# Patient Record
Sex: Female | Born: 1985 | Race: White | Hispanic: No | Marital: Married | Smoking: Never smoker
Health system: Southern US, Community
[De-identification: ages and names within clinical notes are randomized; demographics above are authoritative.]

## PROBLEM LIST (undated history)

## (undated) DIAGNOSIS — Z789 Other specified health status: Secondary | ICD-10-CM

## (undated) HISTORY — PX: NO PAST SURGERIES: SHX2092

---

## 2012-09-09 NOTE — L&D Delivery Note (Signed)
Attestation of Attending Supervision of Advanced Practitioner (CNM/NP): Evaluation and management procedures were performed by the Advanced Practitioner under my supervision and collaboration.  I have reviewed the Advanced Practitioner's note and chart, and I agree with the management and plan.  Delanda Bulluck 05/08/2013 1:26 PM

## 2012-09-09 NOTE — L&D Delivery Note (Signed)
Delivery Note At 5:11 PM a viable female was delivered via Vaginal, Spontaneous Delivery (Presentation: Right Occiput Anterior).  APGAR: 8, 9; weight 6 lb 9.8 oz (3000 g).   Placenta status: Intact, Spontaneous.  Cord: 3 vessels with the following complications: None.  Anesthesia: Local  Episiotomy: None Lacerations: 2nd degree Suture Repair: 3.0 vicryl Est. Blood Loss (mL): 200  Mom to postpartum.  Baby to nursery-stable.  Jacquelin Hawking, MD 05/04/2013, 7:03 PM  Evaluation and management procedures were performed by Resident physician under my supervision/collaboration. Chart reviewed, patient examined by me and I agree with management and plan. I did repair.

## 2012-11-17 ENCOUNTER — Other Ambulatory Visit (HOSPITAL_COMMUNITY): Payer: Self-pay | Admitting: Family

## 2012-11-17 LAB — OB RESULTS CONSOLE ANTIBODY SCREEN: Antibody Screen: NEGATIVE

## 2012-11-17 LAB — OB RESULTS CONSOLE ABO/RH

## 2012-11-17 LAB — OB RESULTS CONSOLE GC/CHLAMYDIA
Chlamydia: NEGATIVE
Gonorrhea: NEGATIVE

## 2012-12-02 ENCOUNTER — Ambulatory Visit (HOSPITAL_COMMUNITY)
Admission: RE | Admit: 2012-12-02 | Discharge: 2012-12-02 | Disposition: A | Discharge status: Home / Self Care | Payer: Self-pay | Source: Ambulatory Visit | Attending: Family | Admitting: Family

## 2012-12-02 DIAGNOSIS — Z3689 Encounter for other specified antenatal screening: Secondary | ICD-10-CM

## 2012-12-02 DIAGNOSIS — O358XX0 Maternal care for other (suspected) fetal abnormality and damage, not applicable or unspecified: Secondary | ICD-10-CM | POA: Insufficient documentation

## 2012-12-02 DIAGNOSIS — Z1389 Encounter for screening for other disorder: Secondary | ICD-10-CM | POA: Insufficient documentation

## 2012-12-02 DIAGNOSIS — Z363 Encounter for antenatal screening for malformations: Secondary | ICD-10-CM | POA: Insufficient documentation

## 2012-12-15 ENCOUNTER — Other Ambulatory Visit (HOSPITAL_COMMUNITY): Payer: Self-pay | Admitting: Physician Assistant

## 2012-12-15 DIAGNOSIS — Z0489 Encounter for examination and observation for other specified reasons: Secondary | ICD-10-CM

## 2012-12-25 ENCOUNTER — Ambulatory Visit (HOSPITAL_COMMUNITY)
Admission: RE | Admit: 2012-12-25 | Discharge: 2012-12-25 | Disposition: A | Discharge status: Home / Self Care | Payer: Self-pay | Source: Ambulatory Visit | Attending: Physician Assistant | Admitting: Physician Assistant

## 2012-12-25 DIAGNOSIS — Z3689 Encounter for other specified antenatal screening: Secondary | ICD-10-CM | POA: Insufficient documentation

## 2012-12-25 DIAGNOSIS — Z0489 Encounter for examination and observation for other specified reasons: Secondary | ICD-10-CM

## 2013-04-12 LAB — OB RESULTS CONSOLE GBS: GBS: NEGATIVE

## 2013-05-04 ENCOUNTER — Encounter (HOSPITAL_COMMUNITY): Payer: Self-pay | Admitting: *Deleted

## 2013-05-04 ENCOUNTER — Inpatient Hospital Stay (HOSPITAL_COMMUNITY)
Admission: AD | Admit: 2013-05-04 | Discharge: 2013-05-06 | DRG: 775 | Disposition: A | Discharge status: Home / Self Care | Payer: Medicaid Other | Source: Ambulatory Visit | Attending: Obstetrics & Gynecology | Admitting: Obstetrics & Gynecology

## 2013-05-04 HISTORY — DX: Other specified health status: Z78.9

## 2013-05-04 LAB — CBC
HCT: 33.4 % — ABNORMAL LOW (ref 36.0–46.0)
Hemoglobin: 10.8 g/dL — ABNORMAL LOW (ref 12.0–15.0)
RBC: 4.57 MIL/uL (ref 3.87–5.11)

## 2013-05-04 LAB — TYPE AND SCREEN
ABO/RH(D): O POS
Antibody Screen: NEGATIVE

## 2013-05-04 LAB — ABO/RH: ABO/RH(D): O POS

## 2013-05-04 MED ORDER — CITRIC ACID-SODIUM CITRATE 334-500 MG/5ML PO SOLN: 30.0000 mL | ORAL | Status: DC | PRN

## 2013-05-04 MED ORDER — LANOLIN HYDROUS EX OINT: TOPICAL_OINTMENT | CUTANEOUS | Status: DC | PRN

## 2013-05-04 MED ORDER — ONDANSETRON HCL 4 MG PO TABS: 4.0000 mg | ORAL_TABLET | ORAL | Status: DC | PRN

## 2013-05-04 MED ORDER — EPHEDRINE 5 MG/ML INJ
10.0000 mg | INTRAVENOUS | Status: DC | PRN
  Filled 2013-05-04: qty 2

## 2013-05-04 MED ORDER — PRENATAL MULTIVITAMIN CH
1.0000 | ORAL_TABLET | Freq: Every day | ORAL | Status: DC
  Administered 2013-05-05 – 2013-05-06 (×2): 1 via ORAL
  Filled 2013-05-04 (×2): qty 1

## 2013-05-04 MED ORDER — ONDANSETRON HCL 4 MG/2ML IJ SOLN: 4.0000 mg | Freq: Four times a day (QID) | INTRAMUSCULAR | Status: DC | PRN

## 2013-05-04 MED ORDER — OXYTOCIN 40 UNITS IN LACTATED RINGERS INFUSION - SIMPLE MED
62.5000 mL/h | INTRAVENOUS | Status: DC
  Administered 2013-05-04: 62.5 mL/h via INTRAVENOUS

## 2013-05-04 MED ORDER — FENTANYL 2.5 MCG/ML BUPIVACAINE 1/10 % EPIDURAL INFUSION (WH - ANES): 14.0000 mL/h | INTRAMUSCULAR | Status: DC | PRN

## 2013-05-04 MED ORDER — PHENYLEPHRINE 40 MCG/ML (10ML) SYRINGE FOR IV PUSH (FOR BLOOD PRESSURE SUPPORT)
80.0000 ug | PREFILLED_SYRINGE | INTRAVENOUS | Status: DC | PRN
  Filled 2013-05-04: qty 2

## 2013-05-04 MED ORDER — DIPHENHYDRAMINE HCL 50 MG/ML IJ SOLN: 12.5000 mg | INTRAMUSCULAR | Status: DC | PRN

## 2013-05-04 MED ORDER — IBUPROFEN 600 MG PO TABS
600.0000 mg | ORAL_TABLET | Freq: Four times a day (QID) | ORAL | Status: DC
  Administered 2013-05-04 – 2013-05-06 (×7): 600 mg via ORAL
  Filled 2013-05-04 (×6): qty 1

## 2013-05-04 MED ORDER — NALBUPHINE SYRINGE 5 MG/0.5 ML
10.0000 mg | INJECTION | INTRAMUSCULAR | Status: DC | PRN
  Administered 2013-05-04: 10 mg via INTRAVENOUS
  Filled 2013-05-04 (×2): qty 1

## 2013-05-04 MED ORDER — OXYTOCIN 40 UNITS IN LACTATED RINGERS INFUSION - SIMPLE MED
1.0000 m[IU]/min | INTRAVENOUS | Status: DC
  Administered 2013-05-04: 1 m[IU]/min via INTRAVENOUS
  Filled 2013-05-04: qty 1000

## 2013-05-04 MED ORDER — SIMETHICONE 80 MG PO CHEW: 80.0000 mg | CHEWABLE_TABLET | ORAL | Status: DC | PRN

## 2013-05-04 MED ORDER — IBUPROFEN 600 MG PO TABS
600.0000 mg | ORAL_TABLET | Freq: Four times a day (QID) | ORAL | Status: DC | PRN
  Administered 2013-05-04: 600 mg via ORAL
  Filled 2013-05-04: qty 1

## 2013-05-04 MED ORDER — LACTATED RINGERS IV SOLN: 500.0000 mL | Freq: Once | INTRAVENOUS | Status: DC

## 2013-05-04 MED ORDER — BENZOCAINE-MENTHOL 20-0.5 % EX AERO
1.0000 "application " | INHALATION_SPRAY | CUTANEOUS | Status: DC | PRN
  Administered 2013-05-04: 1 via TOPICAL
  Filled 2013-05-04: qty 56

## 2013-05-04 MED ORDER — OXYCODONE-ACETAMINOPHEN 5-325 MG PO TABS
1.0000 | ORAL_TABLET | ORAL | Status: DC | PRN
  Administered 2013-05-04: 2 via ORAL
  Filled 2013-05-04: qty 2

## 2013-05-04 MED ORDER — DIPHENHYDRAMINE HCL 25 MG PO CAPS: 25.0000 mg | ORAL_CAPSULE | Freq: Four times a day (QID) | ORAL | Status: DC | PRN

## 2013-05-04 MED ORDER — ACETAMINOPHEN 325 MG PO TABS: 650.0000 mg | ORAL_TABLET | ORAL | Status: DC | PRN

## 2013-05-04 MED ORDER — TERBUTALINE SULFATE 1 MG/ML IJ SOLN: 0.2500 mg | Freq: Once | INTRAMUSCULAR | Status: DC | PRN

## 2013-05-04 MED ORDER — OXYCODONE-ACETAMINOPHEN 5-325 MG PO TABS: 1.0000 | ORAL_TABLET | ORAL | Status: DC | PRN

## 2013-05-04 MED ORDER — DIBUCAINE 1 % RE OINT: 1.0000 "application " | TOPICAL_OINTMENT | RECTAL | Status: DC | PRN

## 2013-05-04 MED ORDER — ZOLPIDEM TARTRATE 5 MG PO TABS: 5.0000 mg | ORAL_TABLET | Freq: Every evening | ORAL | Status: DC | PRN

## 2013-05-04 MED ORDER — WITCH HAZEL-GLYCERIN EX PADS: 1.0000 "application " | MEDICATED_PAD | CUTANEOUS | Status: DC | PRN

## 2013-05-04 MED ORDER — LACTATED RINGERS IV SOLN: 500.0000 mL | INTRAVENOUS | Status: DC | PRN

## 2013-05-04 MED ORDER — OXYTOCIN BOLUS FROM INFUSION
500.0000 mL | INTRAVENOUS | Status: DC
  Administered 2013-05-04: 500 mL via INTRAVENOUS

## 2013-05-04 MED ORDER — ONDANSETRON HCL 4 MG/2ML IJ SOLN: 4.0000 mg | INTRAMUSCULAR | Status: DC | PRN

## 2013-05-04 MED ORDER — TETANUS-DIPHTH-ACELL PERTUSSIS 5-2.5-18.5 LF-MCG/0.5 IM SUSP
0.5000 mL | Freq: Once | INTRAMUSCULAR | Status: AC
  Administered 2013-05-06: 0.5 mL via INTRAMUSCULAR

## 2013-05-04 MED ORDER — LIDOCAINE HCL (PF) 1 % IJ SOLN
30.0000 mL | INTRAMUSCULAR | Status: AC | PRN
  Administered 2013-05-04: 30 mL via SUBCUTANEOUS
  Filled 2013-05-04 (×2): qty 30

## 2013-05-04 MED ORDER — SENNOSIDES-DOCUSATE SODIUM 8.6-50 MG PO TABS
2.0000 | ORAL_TABLET | Freq: Every day | ORAL | Status: DC
  Administered 2013-05-04 – 2013-05-05 (×2): 2 via ORAL

## 2013-05-04 MED ORDER — LACTATED RINGERS IV SOLN
INTRAVENOUS | Status: DC
  Administered 2013-05-04 (×3): via INTRAVENOUS

## 2013-05-04 NOTE — Progress Notes (Signed)
Head circumference remeasured per Dr Andrez Grime request-13.5 inches,33 cms

## 2013-05-04 NOTE — Progress Notes (Signed)
Alyssa Moran is a 27 y.o. G3P2 at [redacted]w[redacted]d by LMP admitted for spontaneous onset of labor  Subjective: Feeling okay. She is comfortable with pain from contractions. She does not currently want pain management   Objective: BP 110/71  Pulse 85  Temp(Src) 98.2 F (36.8 C) (Oral)  Resp 20  Ht 5\' 1"  (1.549 m)  Wt 78.472 kg (173 lb)  BMI 32.7 kg/m2  SpO2 100%      FHT:  FHR: 135 bpm, variability: moderate,  accelerations:  Present,  decelerations:  Absent UC:   irregular, every 5-10 minutes SVE:   Dilation: 5 Effacement (%): 80 Station: -2 Exam by:: Raliegh Ip RN, Jacquelin Hawking, MD  Labs: No results found for this basename: WBC, HGB, HCT, MCV, PLT    Assessment / Plan: Induction of labor due to inadequate contractions of a G3P2 at [redacted]w[redacted]d confirmed by LMP,  Pitocin started  Labor: Progressing normally, on pitocin 1x1 Preeclampsia:  n/a Fetal Wellbeing:  Category I Pain Control:  Labor support without medications I/D:  n/a Anticipated MOD:  NSVD  Jacquelin Hawking, MD 05/04/2013, 12:14 PM

## 2013-05-04 NOTE — Progress Notes (Signed)
Patient ID: Alyssa Moran, female   DOB: [birth year], 27 y.o.   MRN: [id number] Alyssa Moran is a 27 y.o. G3P2 at [redacted]w[redacted]d admitted for SOOL  Subjective: Coping with UC pain.   Objective: BP 115/49  Pulse 61  Temp(Src) 98.2 F (36.8 C) (Oral)  Resp 22  Ht 5\' 1"  (1.549 m)  Wt 78.472 kg (173 lb)  BMI 32.7 kg/m2  SpO2 100%  Fetal Heart FHR: 130 bpm, variability: moderate,  accelerations:  Present,  decelerations:  Absent   Contractions: q 2-3  SVE:   Dilation: 7 Effacement (%): 90 Station: -1 Exam by:: Alyssa Moran CNM  AROM> clear AF  Assessment / Plan:  Labor: Active phase Fetal Wellbeing: Category 1 Pain Control:  Declines analgesia Expected mode of delivery: NSVD  Alyssa Moran,Alyssa Moran 05/04/2013, 4:18 PM

## 2013-05-04 NOTE — H&P (Signed)
Alyssa Moran is a 27 y.o. female G3P2002 at 40.0wks presenting for ctx becoming stronger at midnight. Denies leaking but reports +bloody show. Denies N/V/D or s/s preeclampsia. Her preg has been followed by the [facility] and has been essentially unremarkable. History OB History   Grav Para Term Preterm Abortions TAB SAB Ect Mult Living   3 2        2      Past Medical History  Diagnosis Date  . Medical history non-contributory    Past Surgical History  Procedure Laterality Date  . No past surgeries     Family History: family history is not on file. Social History:  reports that she has never smoked. She does not have any smokeless tobacco history on file. She reports that she does not drink alcohol or use illicit drugs.   Prenatal Transfer Tool  Maternal Diabetes: No Genetic Screening: Normal Maternal Ultrasounds/Referrals: Normal Fetal Ultrasounds or other Referrals:  None Maternal Substance Abuse:  No Significant Maternal Medications:  None Significant Maternal Lab Results:  Lab values include: Group B Strep negative Other Comments:  None  ROS  Dilation: 2 Effacement (%): 80 Station: -2 Exam by:: Weston,RN Blood pressure 118/65, pulse 83, temperature 98.1 F (36.7 C), temperature source Oral, resp. rate 16, height 5\' 1"  (1.549 m), weight 78.472 kg (173 lb), SpO2 100.00%. Maternal Exam:  Uterine Assessment: Ctx q 3-5 mins     Fetal Exam Fetal Monitor Review: Baseline rate: 130s.  Variability: moderate (6-25 bpm).   Pattern: accelerations present and no decelerations.       Physical Exam  Constitutional: She is oriented to person, place, and time. She appears well-developed.  HENT:  Head: Normocephalic.  Cardiovascular: Normal rate.   Respiratory: Effort normal.  Genitourinary:  Cx change in MAU over 1 hour: FT/60 to 2/80  Musculoskeletal: Normal range of motion.  Neurological: She is alert and oriented to person, place, and time.  Skin: Skin is  warm and dry.  Psychiatric: She has a normal mood and affect. Her behavior is normal. Thought content normal.    Prenatal labs: (see scanned prenatal record) ABO, Rh:   Antibody:   Rubella:   RPR:    HBsAg:    HIV:    GBS:     Assessment/Plan: IUP at term Early labor  Admit to [facility] Expectant management Anticipate SVD   Cam Hai 05/04/2013, 7:11 AM

## 2013-05-04 NOTE — H&P (Signed)
Attestation of Attending Supervision of Advanced Practitioner (CNM/NP): Evaluation and management procedures were performed by the Advanced Practitioner under my supervision and collaboration.  I have reviewed the Advanced Practitioner's note and chart, and I agree with the management and plan.  HARRAWAY-SMITH, Taisei Bonnette 9:15 AM

## 2013-05-04 NOTE — MAU Note (Signed)
Labor

## 2013-05-05 LAB — CBC
Hemoglobin: 8.8 g/dL — ABNORMAL LOW (ref 12.0–15.0)
Platelets: 262 10*3/uL (ref 150–400)
RBC: 3.74 MIL/uL — ABNORMAL LOW (ref 3.87–5.11)
WBC: 13.8 10*3/uL — ABNORMAL HIGH (ref 4.0–10.5)

## 2013-05-05 NOTE — Progress Notes (Signed)
Patient was referred for history of depression/anxiety. * Referral screened out by Clinical Social Worker because none of the following criteria appear to apply:  ~ History of anxiety/depression during this pregnancy, or of post-partum depression.  ~ Diagnosis of anxiety and/or depression within last 3 years  ~ History of depression due to pregnancy loss/loss of child  OR * Patient's symptoms currently being treated with medication and/or therapy.  Please contact the Clinical Social Worker if needs arise, or by the patient's request. Pt denies depression history even though history documented in chart.    

## 2013-05-05 NOTE — Lactation Note (Signed)
This note was copied from the chart of Alyssa Madie Cahn. Lactation Consultation Note Baby is latched to the left side in cradle hold when I enter room. Eda present for interpretation. Mom states breast feeding is going very well, denies pain. Mom has experience breastfeeding, and has no concerns or questions at this time.  Reviewed breastfeeding resources with mom; enc mom to call the lactation office if she has any concerns, and to attend the B[facility]. Follow up PRN.   Patient Name: Alyssa Moran JYNWG'N Date: 05/05/2013 Reason for consult: Follow-up assessment   Maternal Data    Feeding Length of feed: 15 min  LATCH Score/Interventions Latch: Grasps breast easily, tongue down, lips flanged, rhythmical sucking.  Audible Swallowing: A few with stimulation  Type of Nipple: Everted at rest and after stimulation  Comfort (Breast/Nipple): Soft / non-tender     Hold (Positioning): No assistance needed to correctly position infant at breast.  LATCH Score: 9  Lactation Tools Discussed/Used     Consult Status Consult Status: PRN    Lenard Forth 05/05/2013, 10:54 AM

## 2013-05-05 NOTE — Progress Notes (Deleted)
Ur chart review completed.  

## 2013-05-05 NOTE — Progress Notes (Signed)
UR completed 

## 2013-05-06 MED ORDER — IBUPROFEN 600 MG PO TABS: 600.0000 mg | ORAL_TABLET | Freq: Four times a day (QID) | ORAL | Status: DC

## 2013-05-06 NOTE — Discharge Summary (Signed)
Obstetric Discharge Summary Reason for Admission: onset of labor Prenatal Procedures: none Intrapartum Procedures: spontaneous vaginal delivery Postpartum Procedures: none Complications-Operative and Postpartum: 2nd degree perineal laceration Hemoglobin  Date Value Range Status  05/05/2013 8.8* 12.0 - 15.0 g/dL Final     DELTA CHECK NOTED     REPEATED TO VERIFY     HCT  Date Value Range Status  05/05/2013 27.6* 36.0 - 46.0 % Final    Physical Exam:  General: alert, cooperative and no distress Heart: RRR Lungs: nl effort Lochia: appropriate Uterine Fundus: firm DVT Evaluation: No evidence of DVT seen on physical exam.  Hospital Course: Alyssa Moran is a 27 y.o. G3P1003 at [redacted]w[redacted]d who was admitted to the hospital on 8/26 after presenting for onset of ctx and bloody show with cx change while in MAU from FT/thick to 2/80%. From that point she progressed to 5cm at which point Pitocin was started for augmentation. She proceeded to SVD later that same afternoon. Her PP course was uneventful and by PP Day #2 she is deemed to have received the full benefit of her hospital stay and she will be discharged home.  Pt is breast feeding and will discuss contraceptive plans further at her PP visit follow up with the [facility] in 6 weeks.   Discharge Diagnoses: Term Pregnancy-delivered  Discharge Information: Date: 05/06/2013 Activity: pelvic rest Diet: routine Medications: PNV and Ibuprofen Condition: stable Instructions: refer to practice specific booklet Discharge to: home   Newborn Data: Live born female  Birth Weight: 6 lb 9.8 oz (3000 g) APGAR: 8, 9  Home with mother.  Cam Hai 05/06/2013, 8:27 AM

## 2013-05-06 NOTE — Care Management Note (Signed)
    Page 1 of 1   05/06/2013     2:03:37 PM   CARE MANAGEMENT NOTE 05/06/2013  Patient:  Alyssa Moran,Lener   Account Number:  [id number]  Date Initiated:  05/06/2013  Documentation initiated by:  CRAFT,TERRI  Subjective/Objective Assessment:   Newborn female infant with hyperbilirubinemia needing home phototherapy     Action/Plan:   D/C when medically stable   Anticipated DC Date:  05/06/2013   Anticipated DC Plan:  HOME W HOME HEALTH SERVICES      DC Planning Services  CM consult      [facility] Choice  DURABLE MEDICAL EQUIPMENT  HOME HEALTH   Choice offered to / List presented to:  C-6 Parent   DME arranged  Margaretann Loveless      DME agency  [facility]     [facility] arranged  HH-1 RN      [facility] agency  [facility]   Status of service:  Completed, signed off  Discharge Disposition:  HOME W HOME HEALTH SERVICES  Per UR Regulation:  Reviewed for med. necessity/level of care/duration of stay  Comments:  05/06/13, Kathi Der RNC-MNN, BSN, [phone], CM received referral.  CM met with pt's mother and father via in house spanish interpreter, Eda Royal, to discuss [facility] order and offer choice for [facility] services.  Pt chose [facility].  [facility]ris[facility] at [facility] contacted with order and confirmation received.  All questions answered via interpreter.

## 2013-05-06 NOTE — Discharge Summary (Signed)
Attestation of Attending Supervision of Advanced Practitioner: Evaluation and management procedures were performed by the PA/NP/CNM/OB Fellow under my supervision/collaboration. Chart reviewed and agree with management and plan.  Marilena Trevathan V 05/06/2013 10:05 AM   

## 2014-07-11 ENCOUNTER — Encounter (HOSPITAL_COMMUNITY): Payer: Self-pay | Admitting: *Deleted

## 2016-07-23 ENCOUNTER — Emergency Department (HOSPITAL_COMMUNITY)
Admission: EM | Admit: 2016-07-23 | Discharge: 2016-07-23 | Disposition: A | Discharge status: Home / Self Care | Payer: Self-pay | Attending: Emergency Medicine | Admitting: Emergency Medicine

## 2016-07-23 ENCOUNTER — Encounter (HOSPITAL_COMMUNITY): Payer: Self-pay | Admitting: Emergency Medicine

## 2016-07-23 DIAGNOSIS — N390 Urinary tract infection, site not specified: Secondary | ICD-10-CM | POA: Insufficient documentation

## 2016-07-23 LAB — COMPREHENSIVE METABOLIC PANEL
ALBUMIN: 4.3 g/dL (ref 3.5–5.0)
ALT: 38 U/L (ref 14–54)
AST: 38 U/L (ref 15–41)
Alkaline Phosphatase: 84 U/L (ref 38–126)
Anion gap: 10 (ref 5–15)
BUN: 7 mg/dL (ref 6–20)
CHLORIDE: 107 mmol/L (ref 101–111)
CO2: 22 mmol/L (ref 22–32)
CREATININE: 0.63 mg/dL (ref 0.44–1.00)
Calcium: 9.4 mg/dL (ref 8.9–10.3)
GFR calc Af Amer: >60 mL/min (ref >60)
GLUCOSE: 107 mg/dL — AB (ref 65–99)
POTASSIUM: 4 mmol/L (ref 3.5–5.1)
SODIUM: 139 mmol/L (ref 135–145)
Total Bilirubin: 0.8 mg/dL (ref 0.3–1.2)
Total Protein: 7.4 g/dL (ref 6.5–8.1)

## 2016-07-23 LAB — CBC WITH DIFFERENTIAL/PLATELET
BASOS ABS: 0.1 10*3/uL (ref 0.0–0.1)
BASOS PCT: 0 %
EOS PCT: 4 %
Eosinophils Absolute: 0.6 10*3/uL (ref 0.0–0.7)
HCT: 40.1 % (ref 36.0–46.0)
Hemoglobin: 14 g/dL (ref 12.0–15.0)
LYMPHS PCT: 16 %
Lymphs Abs: 2.1 10*3/uL (ref 0.7–4.0)
MCH: 30.8 pg (ref 26.0–34.0)
MCHC: 34.9 g/dL (ref 30.0–36.0)
MCV: 88.3 fL (ref 78.0–100.0)
MONO ABS: 0.8 10*3/uL (ref 0.1–1.0)
Monocytes Relative: 6 %
Neutro Abs: 9.7 10*3/uL — ABNORMAL HIGH (ref 1.7–7.7)
Neutrophils Relative %: 74 %
PLATELETS: 332 10*3/uL (ref 150–400)
RBC: 4.54 MIL/uL (ref 3.87–5.11)
RDW: 12.3 % (ref 11.5–15.5)
WBC: 13.2 10*3/uL — AB (ref 4.0–10.5)

## 2016-07-23 LAB — URINE MICROSCOPIC-ADD ON

## 2016-07-23 LAB — URINALYSIS, ROUTINE W REFLEX MICROSCOPIC
Bilirubin Urine: NEGATIVE
Glucose, UA: NEGATIVE mg/dL
KETONES UR: NEGATIVE mg/dL
Nitrite: NEGATIVE
PH: 6 (ref 5.0–8.0)
PROTEIN: 30 mg/dL — AB
SPECIFIC GRAVITY, URINE: 1.003 — AB (ref 1.005–1.030)

## 2016-07-23 LAB — I-STAT BETA HCG BLOOD, ED (MC, WL, AP ONLY): I-stat hCG, quantitative: <5 m[IU]/mL (ref <5)

## 2016-07-23 MED ORDER — HYDROCODONE-ACETAMINOPHEN 5-325 MG PO TABS: 2.0000 | ORAL_TABLET | ORAL | 0 refills | Status: DC | PRN

## 2016-07-23 MED ORDER — ONDANSETRON 4 MG PO TBDP: 4.0000 mg | ORAL_TABLET | Freq: Three times a day (TID) | ORAL | 0 refills | Status: DC | PRN

## 2016-07-23 MED ORDER — ONDANSETRON 4 MG PO TBDP
4.0000 mg | ORAL_TABLET | Freq: Once | ORAL | Status: AC
  Administered 2016-07-23: 4 mg via ORAL
  Filled 2016-07-23: qty 1

## 2016-07-23 MED ORDER — HYDROCODONE-ACETAMINOPHEN 5-325 MG PO TABS
2.0000 | ORAL_TABLET | Freq: Once | ORAL | Status: AC
  Administered 2016-07-23: 2 via ORAL
  Filled 2016-07-23: qty 2

## 2016-07-23 MED ORDER — CEPHALEXIN 250 MG PO CAPS
500.0000 mg | ORAL_CAPSULE | Freq: Once | ORAL | Status: AC
  Administered 2016-07-23: 500 mg via ORAL
  Filled 2016-07-23: qty 2

## 2016-07-23 MED ORDER — PHENAZOPYRIDINE HCL 200 MG PO TABS: 200.0000 mg | ORAL_TABLET | Freq: Three times a day (TID) | ORAL | 0 refills | Status: DC

## 2016-07-23 MED ORDER — CEPHALEXIN 500 MG PO CAPS: 500.0000 mg | ORAL_CAPSULE | Freq: Four times a day (QID) | ORAL | 0 refills | Status: DC

## 2016-07-23 NOTE — ED Triage Notes (Signed)
Pt. reports dysuria with slight hematuria onset 2 days ago with chills , body aches /fatigue , denies fever .

## 2016-07-25 LAB — URINE CULTURE: Culture: >=100000 — AB

## 2016-07-26 ENCOUNTER — Telehealth (HOSPITAL_COMMUNITY): Payer: Self-pay

## 2016-07-26 NOTE — Telephone Encounter (Signed)
Post ED Visit - Positive Culture Follow-up  Culture report reviewed by antimicrobial stewardship pharmacist:  []  Enzo BiNathan Batchelder, Pharm.D. []  Celedonio MiyamotoJeremy Frens, Pharm.D., BCPS []  Garvin FilaMike Maccia, Pharm.D. []  Georgina PillionElizabeth Martin, [address] BCPS []  LockridgeMinh Pham, [address]Ph[address]D., BCPS, AAHIVP []  Estella HuskMichelle Turner, Pharm.D., BCPS, AAHIVP []  Tennis Mustassie Stewart, Pharm.D. []  Sherle Poeob Vincent, [address] Ladona HornsX  Apryl Anderson, Pharm.D.   Positive urine culture, >/= 100,000 colonies -> E Coli Treated with Cephalexin, organism sensitive to the same and no further patient follow-up is required at this time.  Arvid RightClark, Thresia Ramanathan Dorn 07/26/2016, 9:40 AM

## 2016-08-03 NOTE — ED Provider Notes (Signed)
[facility]-EMERGENCY DEPT Provider Note   CSN: [id number] Arrival date & time: 07/23/16  0043     History   Chief Complaint Chief Complaint  Patient presents with  . Dysuria    HPI Alyssa Moran is a 30 y.o. female.  The history is provided by the patient. The history is limited by a language barrier. A language interpreter was used.  Dysuria   This is a new problem. The current episode started 2 days ago. The problem occurs every urination. The problem has been gradually worsening. The quality of the pain is described as burning and aching. The pain is at a severity of 10/10. The pain is severe. There has been no fever. She is sexually active. There is no history of pyelonephritis. Associated symptoms include chills, nausea, frequency, hematuria, urgency and flank pain. Pertinent negatives include no sweats, no vomiting, no discharge, no hesitancy and no possible pregnancy. She has tried nothing for the symptoms. Her past medical history does not include kidney stones, single kidney or recurrent UTIs.    Past Medical History:  Diagnosis Date  . Medical history non-contributory     There are no active problems to display for this patient.   Past Surgical History:  Procedure Laterality Date  . NO PAST SURGERIES      OB History    Gravida Para Term Preterm AB Living   3 3 1     3    SAB TAB Ectopic Multiple Live Births           1       Home Medications    Prior to Admission medications   Medication Sig Start Date End Date Taking? Authorizing Provider  cephALEXin (KEFLEX) 500 MG capsule Take 1 capsule (500 mg total) by mouth 4 (four) times daily. 07/23/16   Danelle Berry, PA-C  HYDROcodone-acetaminophen (NORCO/VICODIN) 5-325 MG tablet Take 2 tablets by mouth every 4 (four) hours as needed. 07/23/16   Danelle Berry, PA-C  ibuprofen (ADVIL,MOTRIN) 600 MG tablet Take 1 tablet (600 mg total) by mouth every 6 (six) hours. 05/06/13   Arabella Merles, CNM  ondansetron  (ZOFRAN ODT) 4 MG disintegrating tablet Take 1 tablet (4 mg total) by mouth every 8 (eight) hours as needed for nausea or vomiting. 07/23/16   Danelle Berry, PA-C  phenazopyridine (PYRIDIUM) 200 MG tablet Take 1 tablet (200 mg total) by mouth 3 (three) times daily. 07/23/16   Danelle Berry, PA-C  prenatal vitamin w/FE, FA (PRENATAL 1 + 1) 27-1 MG TABS tablet Take 1 tablet by mouth daily at 12 noon.    Historical Provider, MD    Family History No family history on file.  Social History Social History  Substance Use Topics  . Smoking status: Never Smoker  . Smokeless tobacco: Never Used  . Alcohol use No     Allergies   Patient has no known allergies.   Review of Systems Review of Systems  Constitutional: Positive for chills and fatigue. Negative for fever.  Gastrointestinal: Positive for nausea. Negative for abdominal pain, diarrhea and vomiting.  Genitourinary: Positive for dysuria, flank pain, frequency, hematuria and urgency. Negative for hesitancy.  Skin: Negative.   Neurological: Negative.   All other systems reviewed and are negative.    Physical Exam Updated Vital Signs BP 118/73   Pulse 83   Temp 97.8 F (36.6 C) (Oral)   Resp 18   LMP 07/07/2016 (Approximate)   SpO2 100%   Physical Exam  Constitutional: She is  oriented to person, place, and time. She appears well-developed and well-nourished. No distress.  HENT:  Head: Normocephalic and atraumatic.  Right Ear: External ear normal.  Left Ear: External ear normal.  Nose: Nose normal.  Mouth/Throat: Oropharynx is clear and moist. No oropharyngeal exudate.  Eyes: Conjunctivae and EOM are normal. Pupils are equal, round, and reactive to light. Right eye exhibits no discharge. Left eye exhibits no discharge. No scleral icterus.  Neck: Normal range of motion. Neck supple. No JVD present. No tracheal deviation present.  Cardiovascular: Normal rate, regular rhythm, normal heart sounds and intact distal pulses.  Exam  reveals no gallop and no friction rub.   No murmur heard. Pulmonary/Chest: Effort normal and breath sounds normal. No stridor. No respiratory distress. She has no wheezes. She has no rales. She exhibits no tenderness.  Abdominal: Soft. Bowel sounds are normal. She exhibits no distension and no mass. There is tenderness. There is no rebound and no guarding.  Mild suprapubic tenderness to palpation w/o guarding or rebound tenderness, bilateral CVA tenderness  Musculoskeletal: Normal range of motion. She exhibits no edema.  Lymphadenopathy:    She has no cervical adenopathy.  Neurological: She is alert and oriented to person, place, and time. She exhibits normal muscle tone. Coordination normal.  Skin: Skin is warm and dry. No rash noted. She is not diaphoretic. No erythema. No pallor.  Psychiatric: She has a normal mood and affect. Her behavior is normal. Judgment and thought content normal.  Nursing note and vitals reviewed.    ED Treatments / Results  Labs (all labs ordered are listed, but only abnormal results are displayed) Labs Reviewed  URINE CULTURE - Abnormal; Notable for the following:       Result Value   Culture >=100,000 COLONIES/mL ESCHERICHIA COLI (*)    Organism ID, Bacteria ESCHERICHIA COLI (*)    All other components within normal limits  CBC WITH DIFFERENTIAL/PLATELET - Abnormal; Notable for the following:    WBC 13.2 (*)    Neutro Abs 9.7 (*)    All other components within normal limits  COMPREHENSIVE METABOLIC PANEL - Abnormal; Notable for the following:    Glucose, Bld 107 (*)    All other components within normal limits  URINALYSIS, ROUTINE W REFLEX MICROSCOPIC (NOT [facility]) - Abnormal; Notable for the following:    APPearance CLOUDY (*)    Specific Gravity, Urine 1.003 (*)    Hgb urine dipstick LARGE (*)    Protein, ur 30 (*)    Leukocytes, UA LARGE (*)    All other components within normal limits  URINE MICROSCOPIC-ADD ON - Abnormal; Notable for the  following:    Squamous Epithelial / LPF 0-5 (*)    Bacteria, UA RARE (*)    All other components within normal limits  I-STAT BETA HCG BLOOD, ED ([facility], [facility], [facility] ONLY)    EKG  EKG Interpretation None       Radiology No results found.  Procedures Procedures (including critical care time)  Medications Ordered in ED Medications  ondansetron (ZOFRAN-ODT) disintegrating tablet 4 mg (4 mg Oral Given 07/23/16 0317)  cephALEXin (KEFLEX) capsule 500 mg (500 mg Oral Given 07/23/16 0317)  HYDROcodone-acetaminophen (NORCO/VICODIN) 5-325 MG per tablet 2 tablet (2 tablets Oral Given 07/23/16 0316)     Initial Impression / Assessment and Plan / ED Course  I have reviewed the triage vital signs and the nursing notes.  Pertinent labs & imaging results that were available during my care of the patient  were reviewed by me and considered in my medical decision making (see chart for details).  Clinical Course    Pt has been diagnosed with a UTI. Pt is afebrile, mild CVA tenderness, normotensive, and denies V. She appears mildly uncomfortable, but is generally well-appearing and PT tolerating PO's in the ED.  Suspect early pyelo, plan to d/c home with oral treatment.  She was encouraged to return to the ER if not tolerating POs or with worsening sx.  REturn precautions reviewed, pt verbalized understanding.  Discharged home in good condition with VSS. Vitals:   07/23/16 0058 07/23/16 0300 07/23/16 0330  BP: 145/87 121/84 118/73  Pulse: 109 87 83  Resp: 18    Temp: 97.8 F (36.6 C)    TempSrc: Oral    SpO2: 100% 100% 100%      Final Clinical Impressions(s) / ED Diagnoses   Final diagnoses:  Lower urinary tract infectious disease    New Prescriptions Discharge Medication List as of 07/23/2016  3:17 AM    START taking these medications   Details  cephALEXin (KEFLEX) 500 MG capsule Take 1 capsule (500 mg total) by mouth 4 (four) times daily., Starting Tue 07/23/2016, Print      HYDROcodone-acetaminophen (NORCO/VICODIN) 5-325 MG tablet Take 2 tablets by mouth every 4 (four) hours as needed., Starting Tue 07/23/2016, Print    ondansetron (ZOFRAN ODT) 4 MG disintegrating tablet Take 1 tablet (4 mg total) by mouth every 8 (eight) hours as needed for nausea or vomiting., Starting Tue 07/23/2016, Print    phenazopyridine (PYRIDIUM) 200 MG tablet Take 1 tablet (200 mg total) by mouth 3 (three) times daily., Starting Tue 07/23/2016, Print         Danelle BerryLeisa Brinlyn Cena, PA-C 08/03/16 0350    Layla MawKristen N Ward, DO 08/04/16 0004

## 2016-09-05 ENCOUNTER — Encounter (HOSPITAL_COMMUNITY): Payer: Self-pay | Admitting: Emergency Medicine

## 2016-09-05 ENCOUNTER — Emergency Department (HOSPITAL_COMMUNITY): Payer: Self-pay

## 2016-09-05 ENCOUNTER — Emergency Department (HOSPITAL_COMMUNITY)
Admission: EM | Admit: 2016-09-05 | Discharge: 2016-09-05 | Disposition: A | Discharge status: Home / Self Care | Payer: Self-pay | Attending: Emergency Medicine | Admitting: Emergency Medicine

## 2016-09-05 DIAGNOSIS — H9203 Otalgia, bilateral: Secondary | ICD-10-CM

## 2016-09-05 DIAGNOSIS — H9202 Otalgia, left ear: Secondary | ICD-10-CM | POA: Insufficient documentation

## 2016-09-05 DIAGNOSIS — J069 Acute upper respiratory infection, unspecified: Secondary | ICD-10-CM | POA: Insufficient documentation

## 2016-09-05 LAB — RAPID STREP SCREEN (MED CTR MEBANE ONLY): Streptococcus, Group A Screen (Direct): NEGATIVE

## 2016-09-05 MED ORDER — PROMETHAZINE VC/CODEINE 6.25-5-10 MG/5ML PO SYRP: 5.0000 mL | ORAL_SOLUTION | ORAL | 0 refills | Status: DC | PRN

## 2016-09-05 MED ORDER — ACETAMINOPHEN 500 MG PO TABS
1000.0000 mg | ORAL_TABLET | Freq: Once | ORAL | Status: AC
  Administered 2016-09-05: 1000 mg via ORAL

## 2016-09-05 MED ORDER — IBUPROFEN 400 MG PO TABS
600.0000 mg | ORAL_TABLET | Freq: Once | ORAL | Status: AC
  Administered 2016-09-05: 600 mg via ORAL
  Filled 2016-09-05: qty 1

## 2016-09-05 MED ORDER — AMOXICILLIN 500 MG PO CAPS: 500.0000 mg | ORAL_CAPSULE | Freq: Three times a day (TID) | ORAL | 0 refills | Status: DC

## 2016-09-05 MED ORDER — IBUPROFEN 600 MG PO TABS: 600.0000 mg | ORAL_TABLET | Freq: Four times a day (QID) | ORAL | 0 refills | Status: DC | PRN

## 2016-09-05 MED ORDER — BENZONATATE 100 MG PO CAPS
200.0000 mg | ORAL_CAPSULE | Freq: Once | ORAL | Status: AC
  Administered 2016-09-05: 200 mg via ORAL
  Filled 2016-09-05: qty 2

## 2016-09-05 MED ORDER — ACETAMINOPHEN 500 MG PO TABS
ORAL_TABLET | ORAL | Status: DC
Start: 2016-09-05 — End: 2016-09-06
  Filled 2016-09-05: qty 2

## 2016-09-05 NOTE — ED Notes (Signed)
Patient transported to X-ray 

## 2016-09-05 NOTE — ED Notes (Signed)
Pt departed in NAD, refused use of wheelchair.  

## 2016-09-05 NOTE — ED Provider Notes (Signed)
[facility]-EMERGENCY DEPT Provider Note   CSN: [id number] Arrival date & time: 09/05/16  [id number]  By signing my name below, I, Alyssa Moran, attest that this documentation has been prepared under the direction and in the presence of Alyssa Mornavid Keyante Durio, NP-C. Electronically Signed: Orpah CobbMaurice Moran , ED Scribe. 09/05/16. 6:38 PM.     History   Chief Complaint Chief Complaint  Patient presents with  . URI  . Fever    HPI  HPI Comments: Alyssa Moran is a 30 y.o. female who presents to the Emergency Department complaining of mild to moderate URI symptoms with sudden onset x3 days. Pt states that 3 days ago she developed generalized body aches with associated fever and chills.  Pt has taken Tylenol with mild relief. She reports productive cough with yellow sputum, sore throat, L otalgia, nausea, appetite change. Pt rates the generalized body aches and otalgia 10/10. Pt states that she can see blood in her mucus when she blows her nose. She denies vomiting.    The history is provided by the patient. A language interpreter was used.  URI   This is a new problem. The current episode started more than 2 days ago. The problem has been gradually worsening. The maximum temperature recorded prior to her arrival was 100 to 100.9 F. The fever has been present for 1 to 2 days. Associated symptoms include nausea, ear pain, rhinorrhea, sore throat and cough. Pertinent negatives include no chest pain, no abdominal pain, no vomiting, no dysuria and no rash. Treatments tried: Tylenol. The treatment provided mild relief.    Past Medical History:  Diagnosis Date  . Medical history non-contributory     There are no active problems to display for this patient.   Past Surgical History:  Procedure Laterality Date  . NO PAST SURGERIES      OB History    Gravida Para Term Preterm AB Living   3 3 1     3    SAB TAB Ectopic Multiple Live Births           1       Home Medications    Prior to  Admission medications   Medication Sig Start Date End Date Taking? Authorizing Provider  cephALEXin (KEFLEX) 500 MG capsule Take 1 capsule (500 mg total) by mouth 4 (four) times daily. 07/23/16   Alyssa BerryLeisa Tapia, PA-C  HYDROcodone-acetaminophen (NORCO/VICODIN) 5-325 MG tablet Take 2 tablets by mouth every 4 (four) hours as needed. 07/23/16   Alyssa BerryLeisa Tapia, PA-C  ibuprofen (ADVIL,MOTRIN) 600 MG tablet Take 1 tablet (600 mg total) by mouth every 6 (six) hours. 05/06/13   Alyssa Moran, CNM  ondansetron (ZOFRAN ODT) 4 MG disintegrating tablet Take 1 tablet (4 mg total) by mouth every 8 (eight) hours as needed for nausea or vomiting. 07/23/16   Alyssa BerryLeisa Tapia, PA-C  phenazopyridine (PYRIDIUM) 200 MG tablet Take 1 tablet (200 mg total) by mouth 3 (three) times daily. 07/23/16   Alyssa BerryLeisa Tapia, PA-C  prenatal vitamin w/FE, FA (PRENATAL 1 + 1) 27-1 MG TABS tablet Take 1 tablet by mouth daily at 12 noon.    Historical Provider, MD    Family History No family history on file.  Social History Social History  Substance Use Topics  . Smoking status: Never Smoker  . Smokeless tobacco: Never Used  . Alcohol use No     Allergies   Patient has no known allergies.   Review of Systems Review of Systems  Constitutional: Positive for appetite  change, chills and fever.  HENT: Positive for ear pain, rhinorrhea and sore throat.   Eyes: Negative for pain and visual disturbance.  Respiratory: Positive for cough. Negative for shortness of breath.   Cardiovascular: Negative for chest pain and palpitations.  Gastrointestinal: Positive for nausea. Negative for abdominal pain and vomiting.  Genitourinary: Negative for dysuria and hematuria.  Musculoskeletal: Positive for back pain and myalgias (generalized body aches). Negative for arthralgias.  Skin: Negative for color change and rash.  Neurological: Negative for seizures and syncope.  All other systems reviewed and are negative.    Physical Exam Updated Vital  Signs BP 136/86 (BP Location: Right Arm)   Pulse (!) 129   Temp 99 F (37.2 C)   Resp 19   SpO2 98%   Physical Exam  Constitutional: She appears well-developed and well-nourished. No distress.  HENT:  Head: Normocephalic and atraumatic.  Right Ear: Tympanic membrane is erythematous.  Left Ear: Tympanic membrane is erythematous.  Mouth/Throat: Posterior oropharyngeal erythema (mild) present. No oropharyngeal exudate.  Eyes: Conjunctivae are normal.  Neck: Neck supple.  Cardiovascular: Normal rate and regular rhythm.   No murmur heard. Pulmonary/Chest: Effort normal and breath sounds normal. No respiratory distress.  Abdominal: Soft. There is no tenderness.  Musculoskeletal: She exhibits no edema.  Neurological: She is alert.  Skin: Skin is warm and dry.  Psychiatric: She has a normal mood and affect.  Nursing note and vitals reviewed.    ED Treatments / Results   DIAGNOSTIC STUDIES: Oxygen Saturation is 98% on RA, normal by my interpretation.   COORDINATION OF CARE: 6:40 PM-Discussed next steps with pt. Pt verbalized understanding and is agreeable with the plan.    Labs (all labs ordered are listed, but only abnormal results are displayed) Labs Reviewed - No data to display  EKG  EKG Interpretation None       Radiology Dg Chest 2 View  Result Date: 09/05/2016 CLINICAL DATA:  Cough and fever. Pt has been sick for 3 days. She has had trouble breathing. EXAM: CHEST  2 VIEW COMPARISON:  None. FINDINGS: The heart size and mediastinal contours are within normal limits. Both lungs are clear. No pleural effusion or pneumothorax. The visualized skeletal structures are unremarkable. IMPRESSION: No active cardiopulmonary disease. Electronically Signed   By: Alyssa Portlandavid  Moran M.D.   On: 09/05/2016 20:17    Procedures Procedures (including critical care time)  Medications Ordered in ED Medications  acetaminophen (TYLENOL) 500 MG tablet (not administered)  acetaminophen  (TYLENOL) tablet 1,000 mg (1,000 mg Oral Given 09/05/16 1704)     Initial Impression / Assessment and Plan / ED Course  I have reviewed the triage vital signs and the nursing notes.  Pertinent labs & imaging results that were available during my care of the patient were reviewed by me and considered in my medical decision making (see chart for details).  Clinical Course     Pt symptoms consistent with URI and otalgia. CXR negative for acute infiltrate. Pt will be discharged with symptomatic treatment.  Discussed return precautions.  Pt is hemodynamically stable & in NAD prior to discharge.   Final Clinical Impressions(s) / ED Diagnoses   Final diagnoses:  Upper respiratory tract infection, unspecified type  Otalgia of both ears    New Prescriptions Discharge Medication List as of 09/05/2016  9:11 PM    START taking these medications   Details  amoxicillin (AMOXIL) 500 MG capsule Take 1 capsule (500 mg total) by mouth 3 (three) times daily.,  Starting Thu 09/05/2016, Print    Promethazine-Phenyleph-Codeine (PROMETHAZINE VC/CODEINE) 6.25-5-10 MG/5ML SYRP Take 5 mLs by mouth every 4 (four) hours as needed., Starting Thu 09/05/2016, Print       I personally performed the services described in this documentation, which was scribed in my presence. The recorded information has been reviewed and is accurate.    Alyssa Morn, NP 09/06/16 [id number]    Gwyneth Sprout, MD 09/07/16 [phone]

## 2016-09-05 NOTE — ED Triage Notes (Signed)
Pt reports sore throat/ cough and chill for the last 3 days.

## 2016-09-08 LAB — CULTURE, GROUP A STREP (THRC)

## 2018-04-13 ENCOUNTER — Other Ambulatory Visit: Payer: Self-pay

## 2018-04-13 ENCOUNTER — Encounter (HOSPITAL_COMMUNITY): Payer: Self-pay | Admitting: Emergency Medicine

## 2018-04-13 ENCOUNTER — Emergency Department (HOSPITAL_COMMUNITY)
Admission: EM | Admit: 2018-04-13 | Discharge: 2018-04-14 | Disposition: A | Discharge status: Home / Self Care | Payer: Self-pay | Attending: Emergency Medicine | Admitting: Emergency Medicine

## 2018-04-13 DIAGNOSIS — R1013 Epigastric pain: Secondary | ICD-10-CM | POA: Insufficient documentation

## 2018-04-13 DIAGNOSIS — R197 Diarrhea, unspecified: Secondary | ICD-10-CM

## 2018-04-13 DIAGNOSIS — R112 Nausea with vomiting, unspecified: Secondary | ICD-10-CM | POA: Insufficient documentation

## 2018-04-13 DIAGNOSIS — Z79899 Other long term (current) drug therapy: Secondary | ICD-10-CM | POA: Insufficient documentation

## 2018-04-13 LAB — CBC
HCT: 41.8 % (ref 36.0–46.0)
Hemoglobin: 14.1 g/dL (ref 12.0–15.0)
MCH: 29.8 pg (ref 26.0–34.0)
MCHC: 33.7 g/dL (ref 30.0–36.0)
MCV: 88.4 fL (ref 78.0–100.0)
Platelets: 328 10*3/uL (ref 150–400)
RBC: 4.73 MIL/uL (ref 3.87–5.11)
RDW: 12.1 % (ref 11.5–15.5)
WBC: 11.3 10*3/uL — ABNORMAL HIGH (ref 4.0–10.5)

## 2018-04-13 LAB — COMPREHENSIVE METABOLIC PANEL
ALBUMIN: 4.2 g/dL (ref 3.5–5.0)
ALK PHOS: 56 U/L (ref 38–126)
ALT: 31 U/L (ref 0–44)
AST: 32 U/L (ref 15–41)
Anion gap: 10 (ref 5–15)
BILIRUBIN TOTAL: 1.8 mg/dL — AB (ref 0.3–1.2)
BUN: 7 mg/dL (ref 6–20)
CALCIUM: 9.3 mg/dL (ref 8.9–10.3)
CO2: 25 mmol/L (ref 22–32)
CREATININE: 0.77 mg/dL (ref 0.44–1.00)
Chloride: 101 mmol/L (ref 98–111)
GFR calc Af Amer: >60 mL/min (ref >60)
GFR calc non Af Amer: >60 mL/min (ref >60)
GLUCOSE: 116 mg/dL — AB (ref 70–99)
Potassium: 3.8 mmol/L (ref 3.5–5.1)
Sodium: 136 mmol/L (ref 135–145)
TOTAL PROTEIN: 7.6 g/dL (ref 6.5–8.1)

## 2018-04-13 LAB — LIPASE, BLOOD: Lipase: 39 U/L (ref 11–51)

## 2018-04-13 LAB — I-STAT BETA HCG BLOOD, ED (MC, WL, AP ONLY)

## 2018-04-13 NOTE — ED Triage Notes (Signed)
Pt c/o abdominal pain with nausea/vomiting that started yesterday.

## 2018-04-14 ENCOUNTER — Emergency Department (HOSPITAL_COMMUNITY): Payer: Self-pay

## 2018-04-14 LAB — URINALYSIS, ROUTINE W REFLEX MICROSCOPIC
BILIRUBIN URINE: NEGATIVE
GLUCOSE, UA: NEGATIVE mg/dL
HGB URINE DIPSTICK: NEGATIVE
KETONES UR: NEGATIVE mg/dL
NITRITE: NEGATIVE
PROTEIN: NEGATIVE mg/dL
Specific Gravity, Urine: 1.008 (ref 1.005–1.030)
pH: 7 (ref 5.0–8.0)

## 2018-04-14 MED ORDER — FAMOTIDINE 20 MG PO TABS: 20.0000 mg | ORAL_TABLET | Freq: Every day | ORAL | 0 refills | Status: DC

## 2018-04-14 MED ORDER — ONDANSETRON 4 MG PO TBDP: 4.0000 mg | ORAL_TABLET | Freq: Three times a day (TID) | ORAL | 0 refills | Status: DC | PRN

## 2018-04-14 MED ORDER — FAMOTIDINE IN NACL 20-0.9 MG/50ML-% IV SOLN
20.0000 mg | Freq: Once | INTRAVENOUS | Status: AC
  Administered 2018-04-14: 20 mg via INTRAVENOUS
  Filled 2018-04-14: qty 50

## 2018-04-14 MED ORDER — ONDANSETRON HCL 4 MG/2ML IJ SOLN
4.0000 mg | Freq: Once | INTRAMUSCULAR | Status: AC
  Administered 2018-04-14: 4 mg via INTRAVENOUS
  Filled 2018-04-14: qty 2

## 2018-04-14 MED ORDER — SODIUM CHLORIDE 0.9 % IV BOLUS
1000.0000 mL | Freq: Once | INTRAVENOUS | Status: AC
  Administered 2018-04-14: 1000 mL via INTRAVENOUS

## 2018-04-14 MED ORDER — KETOROLAC TROMETHAMINE 15 MG/ML IJ SOLN
15.0000 mg | Freq: Once | INTRAMUSCULAR | Status: AC
  Administered 2018-04-14: 15 mg via INTRAVENOUS
  Filled 2018-04-14: qty 1

## 2018-04-14 MED ORDER — SUCRALFATE 1 GM/10ML PO SUSP: 1.0000 g | Freq: Three times a day (TID) | ORAL | 0 refills | Status: DC

## 2018-04-14 MED ORDER — MORPHINE SULFATE (PF) 4 MG/ML IV SOLN
4.0000 mg | Freq: Once | INTRAVENOUS | Status: AC
  Administered 2018-04-14: 4 mg via INTRAVENOUS
  Filled 2018-04-14: qty 1

## 2018-04-14 MED ORDER — IOHEXOL 300 MG/ML  SOLN
100.0000 mL | Freq: Once | INTRAMUSCULAR | Status: AC | PRN
  Administered 2018-04-14: 100 mL via INTRAVENOUS

## 2018-04-14 MED ORDER — GI COCKTAIL ~~LOC~~
30.0000 mL | Freq: Once | ORAL | Status: AC
  Administered 2018-04-14: 30 mL via ORAL
  Filled 2018-04-14: qty 30

## 2018-04-14 NOTE — ED Provider Notes (Signed)
[facility] Provider Note   CSN: [id number] Arrival date & time: 04/13/18  2017     History   Chief Complaint Chief Complaint  Patient presents with  . Abdominal Pain    HPI Lucky CowboyLeticia Moran Nelva BushDe Dominguez is a 32 y.o. female.  HPI 32 year old Hispanic female with no pertinent past medical history presents to the emergency department today for evaluation of generalized abdominal pain, nausea, vomiting and diarrhea.  Symptoms started yesterday.  She describes the pain is cramping in nature.  Localized to the epigastric region.  Reports nonbloody loose stools yesterday.  Reports nausea and 2 episodes of emesis today.  Denies any urinary symptoms.  Nothing makes her symptoms better or worse.  She did not take anything for her symptoms prior to arrival.  No known sick contacts.  No chronic alcohol use or chronic NSAID use.  No recent travel.  Reports history of same pain that she has never had evaluated before.  Pt denies any fever, chill, ha, vision changes, lightheadedness, dizziness, congestion, neck pain, cp, sob, cough, urinary symptoms, vaginal bleeding or discharge, melena, hematochezia, lower extremity paresthesias.  Spanish interpreter was used at bedside.  Past Medical History:  Diagnosis Date  . Medical history non-contributory     There are no active problems to display for this patient.   Past Surgical History:  Procedure Laterality Date  . NO PAST SURGERIES       OB History    Gravida  3   Para  3   Term  1   Preterm      AB      Living  3     SAB      TAB      Ectopic      Multiple      Live Births  1            Home Medications    Prior to Admission medications   Medication Sig Start Date End Date Taking? Authorizing Provider  amoxicillin (AMOXIL) 500 MG capsule Take 1 capsule (500 mg total) by mouth 3 (three) times daily. 09/05/16   Felicie MornSmith, David, NP  cephALEXin (KEFLEX) 500 MG capsule Take 1  capsule (500 mg total) by mouth 4 (four) times daily. 07/23/16   Danelle Berryapia, Leisa, PA-C  HYDROcodone-acetaminophen (NORCO/VICODIN) 5-325 MG tablet Take 2 tablets by mouth every 4 (four) hours as needed. 07/23/16   Danelle Berryapia, Leisa, PA-C  ibuprofen (ADVIL,MOTRIN) 600 MG tablet Take 1 tablet (600 mg total) by mouth every 6 (six) hours as needed. 09/05/16   Felicie MornSmith, David, NP  ondansetron (ZOFRAN ODT) 4 MG disintegrating tablet Take 1 tablet (4 mg total) by mouth every 8 (eight) hours as needed for nausea or vomiting. 07/23/16   Danelle Berryapia, Leisa, PA-C  phenazopyridine (PYRIDIUM) 200 MG tablet Take 1 tablet (200 mg total) by mouth 3 (three) times daily. 07/23/16   Danelle Berryapia, Leisa, PA-C  prenatal vitamin w/FE, FA (PRENATAL 1 + 1) 27-1 MG TABS tablet Take 1 tablet by mouth daily at 12 noon.    [provider]  Promethazine-Phenyleph-Codeine (PROMETHAZINE VC/CODEINE) 6.25-5-10 MG/5ML SYRP Take 5 mLs by mouth every 4 (four) hours as needed. 09/05/16   Felicie MornSmith, David, NP    Family History No family history on file.  Social History Social History   Tobacco Use  . Smoking status: Never Smoker  . Smokeless tobacco: Never Used  Substance Use Topics  . Alcohol use: No  . Drug use: No  Allergies   Patient has no known allergies.   Review of Systems Review of Systems  All other systems reviewed and are negative.    Physical Exam Updated Vital Signs BP 128/72   Pulse 73   Temp 98.1 F (36.7 C) (Oral)   Resp 12   SpO2 100%   Physical Exam  Constitutional: She is oriented to person, place, and time. She appears well-developed and well-nourished.  Non-toxic appearance. No distress.  HENT:  Head: Normocephalic and atraumatic.  Nose: Nose normal.  Mouth/Throat: Oropharynx is clear and moist.  Eyes: Pupils are equal, round, and reactive to light. Conjunctivae are normal. Right eye exhibits no discharge. Left eye exhibits no discharge.  Neck: Normal range of motion. Neck supple.  Cardiovascular:  Normal rate, regular rhythm, normal heart sounds and intact distal pulses. Exam reveals no gallop and no friction rub.  No murmur heard. Pulmonary/Chest: Effort normal and breath sounds normal. No stridor. No respiratory distress. She has no wheezes. She has no rales. She exhibits no tenderness.  Abdominal: Soft. Normal appearance. Bowel sounds are increased. There is tenderness in the epigastric area and left upper quadrant. There is no rigidity, no rebound, no guarding, no CVA tenderness, no tenderness at McBurney's point and negative Murphy's sign.  Musculoskeletal: Normal range of motion. She exhibits no tenderness.  Lymphadenopathy:    She has no cervical adenopathy.  Neurological: She is alert and oriented to person, place, and time.  Skin: Skin is warm and dry. Capillary refill takes less than 2 seconds.  Psychiatric: Her behavior is normal. Judgment and thought content normal.  Nursing note and vitals reviewed.    ED Treatments / Results  Labs (all labs ordered are listed, but only abnormal results are displayed) Labs Reviewed  COMPREHENSIVE METABOLIC PANEL - Abnormal; Notable for the following components:      Result Value   Glucose, Bld 116 (*)    Total Bilirubin 1.8 (*)    All other components within normal limits  CBC - Abnormal; Notable for the following components:   WBC 11.3 (*)    All other components within normal limits  URINALYSIS, ROUTINE W REFLEX MICROSCOPIC - Abnormal; Notable for the following components:   APPearance HAZY (*)    Leukocytes, UA TRACE (*)    Bacteria, UA RARE (*)    All other components within normal limits  LIPASE, BLOOD  I-STAT BETA HCG BLOOD, ED ([facility], [facility], [facility] ONLY)    EKG None  Radiology Ct Abdomen Pelvis W Contrast  Result Date: 04/14/2018 CLINICAL DATA:  32 y/o F; abdominal pain with nausea and vomiting that started yesterday. EXAM: CT ABDOMEN AND PELVIS WITH CONTRAST TECHNIQUE: Multidetector CT imaging of the abdomen and pelvis was  performed using the standard protocol following bolus administration of intravenous contrast. CONTRAST:  OMNIPAQUE IOHEXOL 300 MG/ML  SOLN COMPARISON:  None. FINDINGS: Lower chest: No acute abnormality. Hepatobiliary: No focal liver abnormality is seen. No gallstones, gallbladder wall thickening, or biliary dilatation. Pancreas: Unremarkable. No pancreatic ductal dilatation or surrounding inflammatory changes. Spleen: Normal in size without focal abnormality. Adrenals/Urinary Tract: Adrenal glands are unremarkable. Kidneys are normal, without renal calculi, focal lesion, or hydronephrosis. Bladder is unremarkable. Stomach/Bowel: Stomach is within normal limits. Appendix appears normal. No evidence of bowel wall thickening, distention, or inflammatory changes. Vascular/Lymphatic: No significant vascular findings are present. No enlarged abdominal or pelvic lymph nodes. Reproductive: Uterus and bilateral adnexa are unremarkable. IUD well seated within the uterine fundus. Other: No abdominal wall hernia  or abnormality. No abdominopelvic ascites. Musculoskeletal: No fracture is seen. IMPRESSION: No acute process identified. Unremarkable CT of the abdomen and pelvis. Electronically Signed   By: Mitzi Hansen M.D.   On: 04/14/2018 03:49    Procedures Procedures (including critical care time)  Medications Ordered in ED Medications  sodium chloride 0.9 % bolus 1,000 mL (0 mLs Intravenous Stopped 04/14/18 0400)  morphine 4 MG/ML injection 4 mg (4 mg Intravenous Given 04/14/18 0241)  ondansetron (ZOFRAN) injection 4 mg (4 mg Intravenous Given 04/14/18 0241)  gi cocktail (Maalox,Lidocaine,Donnatal) (30 mLs Oral Given 04/14/18 0241)  famotidine (PEPCID) IVPB 20 mg premix (0 mg Intravenous Stopped 04/14/18 0317)  iohexol (OMNIPAQUE) 300 MG/ML solution 100 mL (100 mLs Intravenous Contrast Given 04/14/18 0324)  ketorolac (TORADOL) 15 MG/ML injection 15 mg (15 mg Intravenous Given 04/14/18 0405)     Initial  Impression / Assessment and Plan / ED Course  I have reviewed the triage vital signs and the nursing notes.  Pertinent labs & imaging results that were available during my care of the patient were reviewed by me and considered in my medical decision making (see chart for details).     Patient is nontoxic, nonseptic appearing, in no apparent distress.  Patient's pain and other symptoms adequately managed in emergency department.  Fluid bolus given.  Labs, imaging and vitals reviewed.  Mild leukocytosis of 11,000.  Normal hemoglobin.  No significant electrolyte derangement.  Normal liver enzymes and kidney function.  Lipase is normal.  UA shows no signs of infection.  Patient does not meet the SIRS or Sepsis criteria.  On repeat exam patient does not have a surgical abdomin and there are no peritoneal signs.  No indication of appendicitis, bowel obstruction, bowel perforation, cholecystitis, diverticulitis, PID or ectopic pregnancy.  Unknown etiology of patient's symptoms.  Suggest PUD versus GERD versus enteritis.  No indication for antibiotics as patient is afebrile without any bloody stools.  Pain improved with GI cocktail, Pepcid, Zofran and Toradol.  Patient discharged home with symptomatic treatment and given strict instructions for follow-up with their primary care physician GI for ongoing symptoms.  I have also discussed reasons to return immediately to the ER.  Patient expresses understanding and agrees with plan.  Pt is hemodynamically stable, in NAD, & able to ambulate in the ED. Evaluation does not show pathology that would require ongoing emergent intervention or inpatient treatment. I explained the diagnosis to the patient. Pain has been managed & has no complaints prior to dc. Pt is comfortable with above plan and is stable for discharge at this time. All questions were answered prior to disposition. Strict return precautions for f/u to the ED were discussed. Encouraged follow up with  PCP.      Final Clinical Impressions(s) / ED Diagnoses   Final diagnoses:  Epigastric pain  Nausea vomiting and diarrhea    ED Discharge Orders        Ordered    sucralfate (CARAFATE) 1 GM/10ML suspension  3 times daily with meals & bedtime     04/14/18 0420    ondansetron (ZOFRAN ODT) 4 MG disintegrating tablet  Every 8 hours PRN     04/14/18 0420    famotidine (PEPCID) 20 MG tablet  Daily     04/14/18 0420       Rise Mu, PA-C 04/14/18 0426    Palumbo, April, MD 04/14/18 [phone]

## 2018-04-14 NOTE — Discharge Instructions (Addendum)
Your abdominal pain is likely from gastritis, reflux or a stomach ulcer. You will need to take the prescribed proton pump inhibitor as directed, and avoid spicy/fatty/acidic foods. Avoid laying down flat within 30 minutes of eating. Avoid NSAIDs like ibuprofen or Aleve on an empty stomach. Use zofran as needed for nausea. Follow up with the gastroenterologist (GI doctor) listed for ongoing evaluation of your abdominal pain. Return to the ER for new or worsening symptoms, any additional concers. ° ° °SEEK IMMEDIATE MEDICAL ATTENTION IF YOU DEVELOP ANY OF THE FOLLOWING SYMPTOMS: °The pain does not go away or becomes severe.  °A temperature above 101 develops.  °Repeated vomiting occurs (multiple episodes).  °Blood is being passed in stools or vomit (bright red or black tarry stools).  °Return also if you develop chest pain, difficulty breathing, dizziness or fainting ° °

## 2018-04-18 IMAGING — DX DG CHEST 2V
2 series · 2 of 2 positions shown · non-contrast
Comparison: None.

CLINICAL DATA: Cough and fever. Pt has been sick for 3 days. She
has had trouble breathing.

EXAM:
CHEST  2 VIEW

[chest pa]
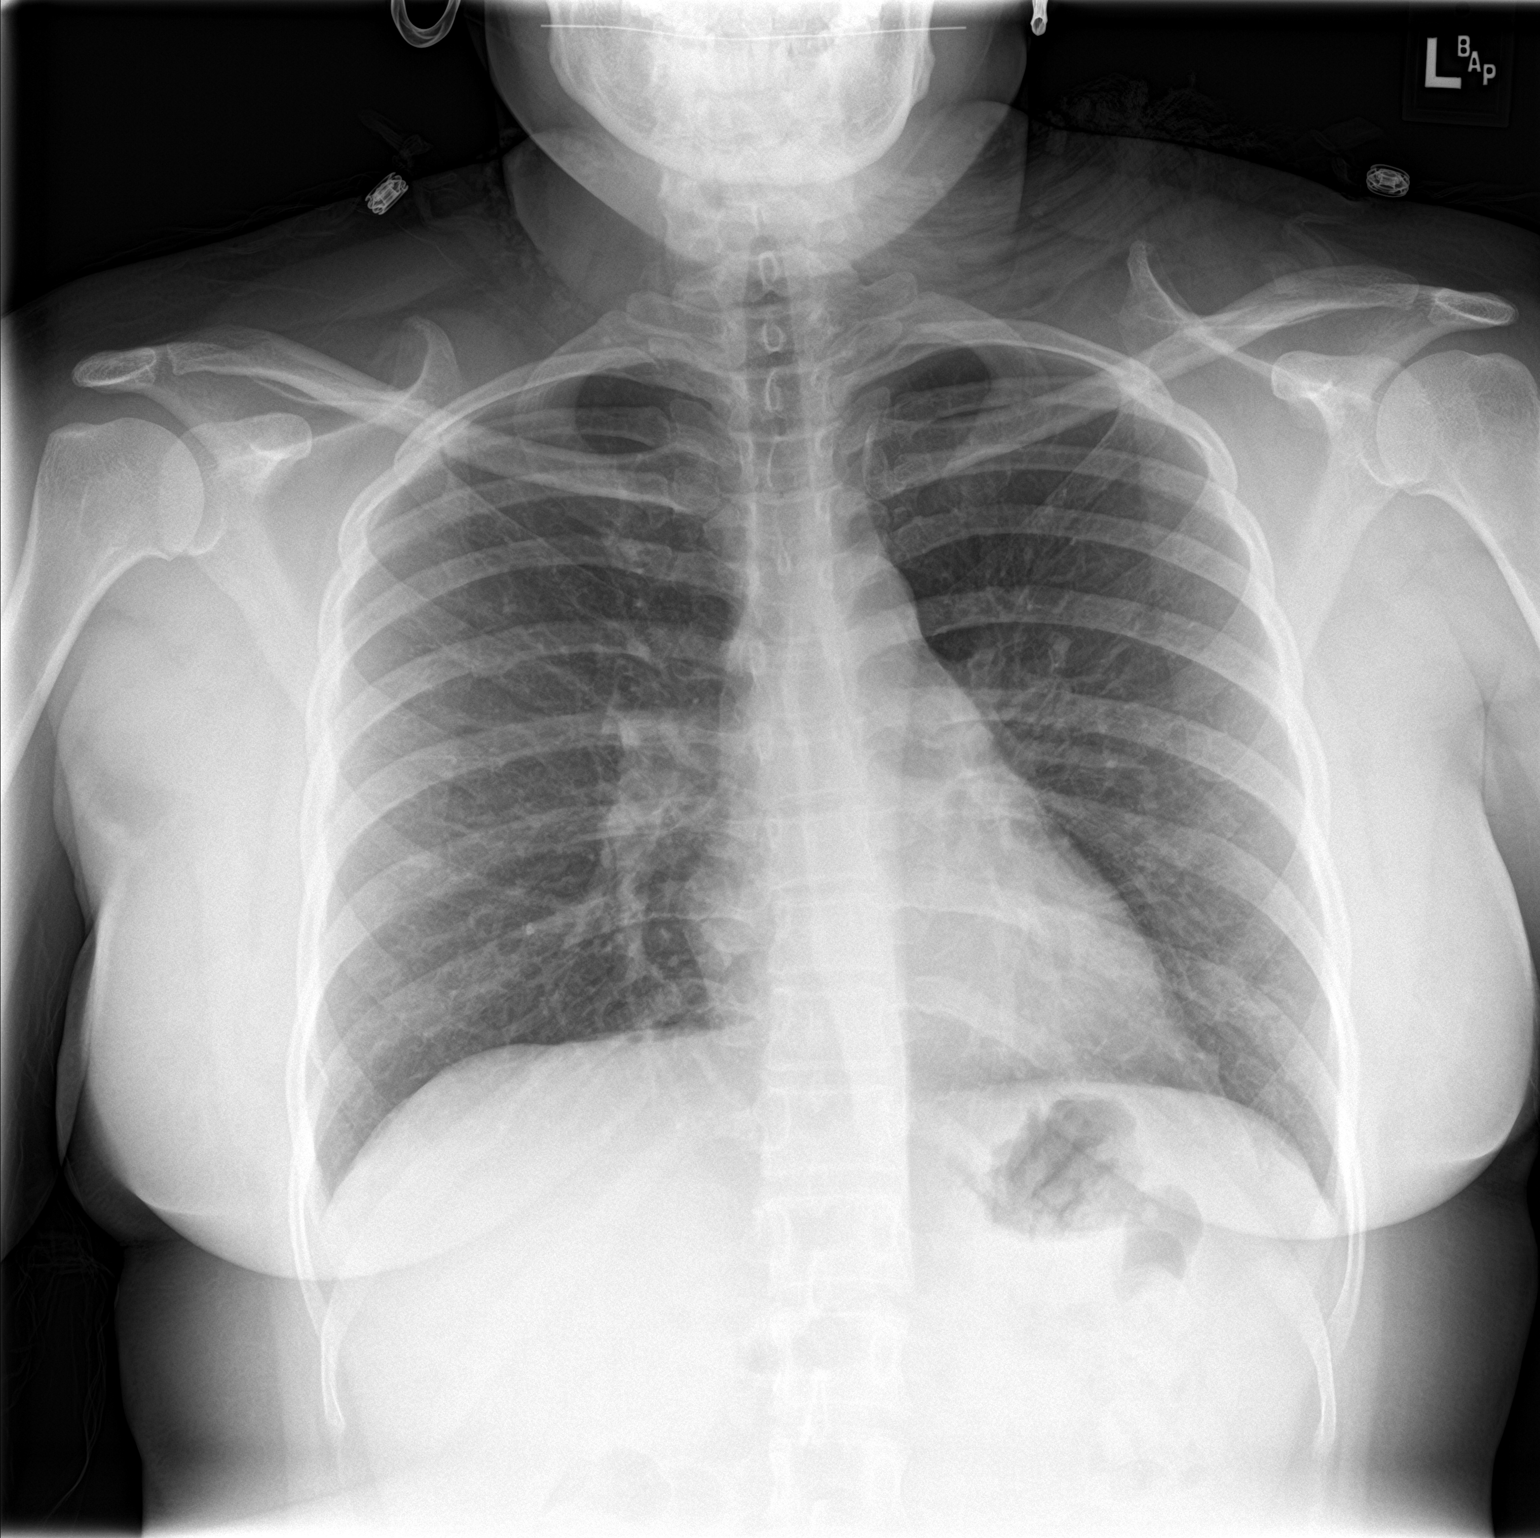

[chest lat]
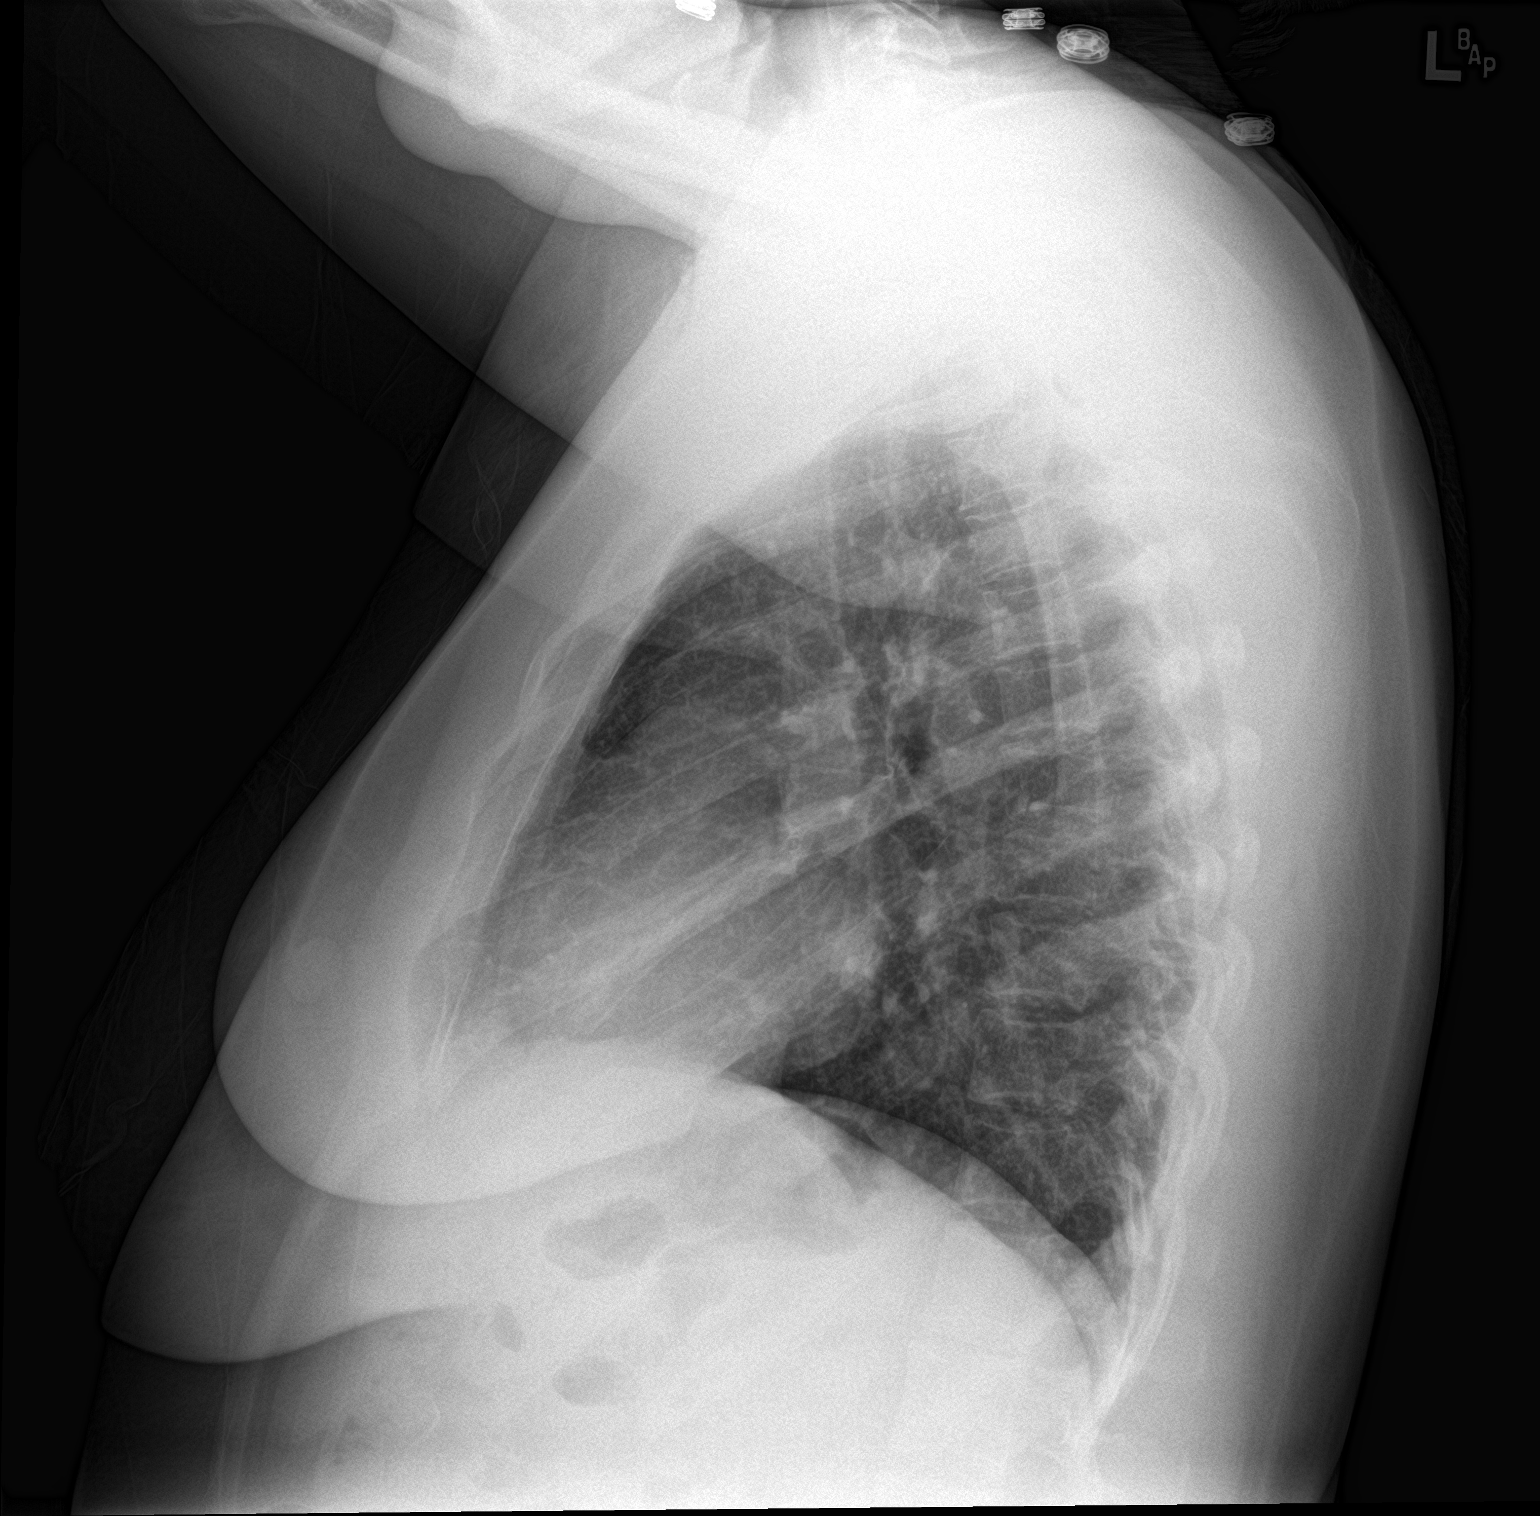

[2 of 2 positions shown; findings below may reference images not displayed]

FINDINGS: The heart size and mediastinal contours are within normal limits.
Both lungs are clear. No pleural effusion or pneumothorax. The
visualized skeletal structures are unremarkable.
IMPRESSION: No active cardiopulmonary disease.

## 2018-10-24 ENCOUNTER — Other Ambulatory Visit: Payer: Self-pay

## 2018-10-24 ENCOUNTER — Encounter (HOSPITAL_BASED_OUTPATIENT_CLINIC_OR_DEPARTMENT_OTHER): Payer: Self-pay | Admitting: *Deleted

## 2018-10-24 ENCOUNTER — Emergency Department (HOSPITAL_BASED_OUTPATIENT_CLINIC_OR_DEPARTMENT_OTHER)
Admission: EM | Admit: 2018-10-24 | Discharge: 2018-10-25 | Disposition: A | Discharge status: Home / Self Care | Payer: Self-pay | Attending: Emergency Medicine | Admitting: Emergency Medicine

## 2018-10-24 DIAGNOSIS — K529 Noninfective gastroenteritis and colitis, unspecified: Secondary | ICD-10-CM

## 2018-10-24 DIAGNOSIS — R112 Nausea with vomiting, unspecified: Secondary | ICD-10-CM | POA: Insufficient documentation

## 2018-10-24 DIAGNOSIS — Z79899 Other long term (current) drug therapy: Secondary | ICD-10-CM | POA: Insufficient documentation

## 2018-10-24 DIAGNOSIS — R197 Diarrhea, unspecified: Secondary | ICD-10-CM | POA: Insufficient documentation

## 2018-10-24 DIAGNOSIS — R1084 Generalized abdominal pain: Secondary | ICD-10-CM | POA: Insufficient documentation

## 2018-10-24 LAB — CBC WITH DIFFERENTIAL/PLATELET
Abs Immature Granulocytes: 0.03 10*3/uL (ref 0.00–0.07)
BASOS PCT: 0 %
Basophils Absolute: 0 10*3/uL (ref 0.0–0.1)
Eosinophils Absolute: 0.1 10*3/uL (ref 0.0–0.5)
Eosinophils Relative: 1 %
HCT: 39.3 % (ref 36.0–46.0)
HEMOGLOBIN: 12.9 g/dL (ref 12.0–15.0)
Immature Granulocytes: 0 %
LYMPHS PCT: 9 %
Lymphs Abs: 0.9 10*3/uL (ref 0.7–4.0)
MCH: 29.5 pg (ref 26.0–34.0)
MCHC: 32.8 g/dL (ref 30.0–36.0)
MCV: 89.9 fL (ref 80.0–100.0)
Monocytes Absolute: 0.6 10*3/uL (ref 0.1–1.0)
Monocytes Relative: 6 %
NEUTROS ABS: 8.3 10*3/uL — AB (ref 1.7–7.7)
Neutrophils Relative %: 84 %
Platelets: 262 10*3/uL (ref 150–400)
RBC: 4.37 MIL/uL (ref 3.87–5.11)
RDW: 12.8 % (ref 11.5–15.5)
WBC: 10 10*3/uL (ref 4.0–10.5)
nRBC: 0 % (ref 0.0–0.2)

## 2018-10-24 LAB — URINALYSIS, ROUTINE W REFLEX MICROSCOPIC
Bilirubin Urine: NEGATIVE
Glucose, UA: NEGATIVE mg/dL
HGB URINE DIPSTICK: NEGATIVE
Ketones, ur: NEGATIVE mg/dL
Leukocytes,Ua: NEGATIVE
NITRITE: NEGATIVE
PH: 6 (ref 5.0–8.0)
Protein, ur: NEGATIVE mg/dL
SPECIFIC GRAVITY, URINE: 1.02 (ref 1.005–1.030)

## 2018-10-24 LAB — PREGNANCY, URINE: PREG TEST UR: NEGATIVE

## 2018-10-24 MED ORDER — ONDANSETRON HCL 4 MG/2ML IJ SOLN
4.0000 mg | Freq: Once | INTRAMUSCULAR | Status: AC
  Administered 2018-10-24: 4 mg via INTRAVENOUS
  Filled 2018-10-24: qty 2

## 2018-10-24 MED ORDER — KETOROLAC TROMETHAMINE 30 MG/ML IJ SOLN
30.0000 mg | Freq: Once | INTRAMUSCULAR | Status: AC
  Administered 2018-10-24: 30 mg via INTRAVENOUS
  Filled 2018-10-24: qty 1

## 2018-10-24 MED ORDER — SODIUM CHLORIDE 0.9 % IV BOLUS
1000.0000 mL | Freq: Once | INTRAVENOUS | Status: AC
  Administered 2018-10-24: 1000 mL via INTRAVENOUS

## 2018-10-24 NOTE — ED Provider Notes (Signed)
[facility] EMERGENCY DEPARTMENT Provider Note   CSN: [id number] Arrival date & time: 10/24/18  2217     History   Chief Complaint Chief Complaint  Patient presents with  . Abdominal Pain    HPI Alyssa Moran is a 33 y.o. female.  Patient is a 33 year old female with no significant past medical history.  She presents with complaints of nausea, vomiting, diarrhea that started this morning.  She has had multiple episodes of weakness.  She denies any bloody stool or vomit.  She has felt fevered, but has not checked her temperature.  She denies any ill contacts or exposures.  She denies having consumed any suspicious foods.  Of note is that this patient does not speak [place], only [place].  History was taken with the use of the translator tablet.  The history is provided by the patient.  Abdominal Pain  Pain location:  Generalized Pain quality: cramping   Pain radiates to:  Does not radiate Pain severity:  Moderate Onset quality:  Sudden Duration:  12 hours Timing:  Constant Progression:  Worsening Relieved by:  Nothing Worsened by:  Nothing Ineffective treatments:  None tried Associated symptoms: no chills, no fever, no hematemesis and no hematochezia     Past Medical History:  Diagnosis Date  . Medical history non-contributory     There are no active problems to display for this patient.   Past Surgical History:  Procedure Laterality Date  . NO PAST SURGERIES       OB History    Gravida  3   Para  3   Term  1   Preterm      AB      Living  3     SAB      TAB      Ectopic      Multiple      Live Births  1            Home Medications    Prior to Admission medications   Medication Sig Start Date End Date Taking? Authorizing Provider  amoxicillin (AMOXIL) 500 MG capsule Take 1 capsule (500 mg total) by mouth 3 (three) times daily. 09/05/16   Felicie Morn, NP  cephALEXin (KEFLEX) 500 MG capsule Take 1 capsule (500  mg total) by mouth 4 (four) times daily. 07/23/16   Danelle Berry, PA-C  famotidine (PEPCID) 20 MG tablet Take 1 tablet (20 mg total) by mouth daily. 04/14/18   Rise Mu, PA-C  HYDROcodone-acetaminophen (NORCO/VICODIN) 5-325 MG tablet Take 2 tablets by mouth every 4 (four) hours as needed. 07/23/16   Danelle Berry, PA-C  ibuprofen (ADVIL,MOTRIN) 600 MG tablet Take 1 tablet (600 mg total) by mouth every 6 (six) hours as needed. 09/05/16   Felicie Morn, NP  ondansetron (ZOFRAN ODT) 4 MG disintegrating tablet Take 1 tablet (4 mg total) by mouth every 8 (eight) hours as needed for nausea or vomiting. 04/14/18   Rise Mu, PA-C  phenazopyridine (PYRIDIUM) 200 MG tablet Take 1 tablet (200 mg total) by mouth 3 (three) times daily. 07/23/16   Danelle Berry, PA-C  prenatal vitamin w/FE, FA (PRENATAL 1 + 1) 27-1 MG TABS tablet Take 1 tablet by mouth daily at 12 noon.    [provider]  Promethazine-Phenyleph-Codeine (PROMETHAZINE VC/CODEINE) 6.25-5-10 MG/5ML SYRP Take 5 mLs by mouth every 4 (four) hours as needed. 09/05/16   Felicie Morn, NP  sucralfate (CARAFATE) 1 GM/10ML suspension Take 10 mLs (1 g total)  by mouth 4 (four) times daily -  with meals and at bedtime. 04/14/18   Rise Mu, PA-C    Family History No family history on file.  Social History Social History   Tobacco Use  . Smoking status: Never Smoker  . Smokeless tobacco: Never Used  Substance Use Topics  . Alcohol use: No  . Drug use: No     Allergies   Patient has no known allergies.   Review of Systems Review of Systems  Constitutional: Negative for chills and fever.  Gastrointestinal: Positive for abdominal pain. Negative for hematemesis and hematochezia.  All other systems reviewed and are negative.    Physical Exam Updated Vital Signs BP 126/81 (BP Location: Left Arm)   Pulse 92   Temp 98.5 F (36.9 C) (Oral)   Resp 18   Wt 81.2 kg   LMP 09/09/2018 (Approximate)   SpO2 100%    BMI 33.82 kg/m   Physical Exam Vitals signs and nursing note reviewed.  Constitutional:      General: She is not in acute distress.    Appearance: She is well-developed. She is not diaphoretic.  HENT:     Head: Normocephalic and atraumatic.  Neck:     Musculoskeletal: Normal range of motion and neck supple.  Cardiovascular:     Rate and Rhythm: Normal rate and regular rhythm.     Heart sounds: No murmur. No friction rub. No gallop.   Pulmonary:     Effort: Pulmonary effort is normal. No respiratory distress.     Breath sounds: Normal breath sounds. No wheezing.  Abdominal:     General: Bowel sounds are normal. There is no distension.     Palpations: Abdomen is soft.     Tenderness: There is generalized abdominal tenderness. There is no right CVA tenderness, left CVA tenderness, guarding or rebound.  Musculoskeletal: Normal range of motion.  Skin:    General: Skin is warm and dry.  Neurological:     Mental Status: She is alert and oriented to person, place, and time.      ED Treatments / Results  Labs (all labs ordered are listed, but only abnormal results are displayed) Labs Reviewed  URINALYSIS, ROUTINE W REFLEX MICROSCOPIC  PREGNANCY, URINE  COMPREHENSIVE METABOLIC PANEL  LIPASE, BLOOD  CBC WITH DIFFERENTIAL/PLATELET    EKG None  Radiology No results found.  Procedures Procedures (including critical care time)  Medications Ordered in ED Medications  sodium chloride 0.9 % bolus 1,000 mL (has no administration in time range)  ondansetron (ZOFRAN) injection 4 mg (has no administration in time range)  ketorolac (TORADOL) 30 MG/ML injection 30 mg (has no administration in time range)     Initial Impression / Assessment and Plan / ED Course  I have reviewed the triage vital signs and the nursing notes.  Pertinent labs & imaging results that were available during my care of the patient were reviewed by me and considered in my medical decision making (see chart  for details).  Laboratory studies are reassuring.  Abdominal exam is benign.  Patient's presentation is most consistent with a viral gastroenteritis.  She is feeling better after IV fluids and medications.  She will be discharged with Zofran, clear liquids, and return as needed if her symptoms worsen.  Final Clinical Impressions(s) / ED Diagnoses   Final diagnoses:  None    ED Discharge Orders    None       Geoffery Lyons, MD 10/25/18 0045

## 2018-10-24 NOTE — ED Triage Notes (Signed)
C/o n/v/d since yesterday. Vomited x 4 today. Pt does not speak [place]

## 2018-10-24 NOTE — ED Notes (Signed)
ED Provider at bedside. 

## 2018-10-25 LAB — COMPREHENSIVE METABOLIC PANEL
ALT: 16 U/L (ref 0–44)
AST: 19 U/L (ref 15–41)
Albumin: 3.8 g/dL (ref 3.5–5.0)
Alkaline Phosphatase: 44 U/L (ref 38–126)
Anion gap: 6 (ref 5–15)
BUN: <5 mg/dL — ABNORMAL LOW (ref 6–20)
CHLORIDE: 106 mmol/L (ref 98–111)
CO2: 22 mmol/L (ref 22–32)
CREATININE: 0.66 mg/dL (ref 0.44–1.00)
Calcium: 8.4 mg/dL — ABNORMAL LOW (ref 8.9–10.3)
GFR calc non Af Amer: >60 mL/min (ref >60)
Glucose, Bld: 94 mg/dL (ref 70–99)
POTASSIUM: 3 mmol/L — AB (ref 3.5–5.1)
SODIUM: 134 mmol/L — AB (ref 135–145)
Total Bilirubin: 0.7 mg/dL (ref 0.3–1.2)
Total Protein: 7 g/dL (ref 6.5–8.1)

## 2018-10-25 LAB — LIPASE, BLOOD: Lipase: 32 U/L (ref 11–51)

## 2018-10-25 MED ORDER — ONDANSETRON 8 MG PO TBDP: ORAL_TABLET | ORAL | 0 refills | Status: DC

## 2018-10-25 NOTE — Discharge Instructions (Addendum)
Zofran as prescribed as needed for nausea. ° °Clear liquid diet for the next 12 hours, then slowly advance to normal as tolerated. ° °Return to the emergency department for severe abdominal pain, high fevers, bloody stools, or other new and concerning symptoms. °

## 2018-10-25 NOTE — ED Notes (Addendum)
Discharge instructions and RX provided, utilizing pacific telephone interpreters.

## 2019-02-21 ENCOUNTER — Other Ambulatory Visit: Payer: Self-pay

## 2019-02-21 ENCOUNTER — Encounter (HOSPITAL_BASED_OUTPATIENT_CLINIC_OR_DEPARTMENT_OTHER): Payer: Self-pay | Admitting: Emergency Medicine

## 2019-02-21 ENCOUNTER — Emergency Department (HOSPITAL_BASED_OUTPATIENT_CLINIC_OR_DEPARTMENT_OTHER)
Admission: EM | Admit: 2019-02-21 | Discharge: 2019-02-22 | Disposition: A | Discharge status: Home / Self Care | Payer: Self-pay | Attending: Emergency Medicine | Admitting: Emergency Medicine

## 2019-02-21 DIAGNOSIS — F410 Panic disorder [episodic paroxysmal anxiety] without agoraphobia: Secondary | ICD-10-CM | POA: Insufficient documentation

## 2019-02-21 DIAGNOSIS — R064 Hyperventilation: Secondary | ICD-10-CM | POA: Insufficient documentation

## 2019-02-21 DIAGNOSIS — Z79899 Other long term (current) drug therapy: Secondary | ICD-10-CM | POA: Insufficient documentation

## 2019-02-21 MED ORDER — ALUM & MAG HYDROXIDE-SIMETH 200-200-20 MG/5ML PO SUSP
30.0000 mL | Freq: Once | ORAL | Status: AC
  Administered 2019-02-21: 30 mL via ORAL
  Filled 2019-02-21: qty 30
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
## 2019-02-22 ENCOUNTER — Encounter: Payer: Self-pay | Admitting: Emergency Medicine

## 2020-04-27 ENCOUNTER — Encounter: Payer: Self-pay | Admitting: *Deleted

## 2020-05-03 ENCOUNTER — Ambulatory Visit (INDEPENDENT_AMBULATORY_CARE_PROVIDER_SITE_OTHER): Payer: Self-pay | Admitting: Obstetrics & Gynecology

## 2020-05-03 ENCOUNTER — Encounter: Payer: Self-pay | Admitting: Obstetrics & Gynecology

## 2020-05-03 ENCOUNTER — Other Ambulatory Visit: Payer: Self-pay

## 2020-05-03 VITALS — BP 147/102 | HR 73 | Wt 179.3 lb

## 2020-05-03 DIAGNOSIS — B977 Papillomavirus as the cause of diseases classified elsewhere: Secondary | ICD-10-CM

## 2020-05-03 DIAGNOSIS — N945 Secondary dysmenorrhea: Secondary | ICD-10-CM

## 2020-05-03 MED ORDER — KETOROLAC TROMETHAMINE 10 MG PO TABS: 10.0000 mg | ORAL_TABLET | Freq: Four times a day (QID) | ORAL | 0 refills | Status: AC | PRN

## 2020-05-03 MED ORDER — SINECATECHINS 15 % EX OINT: 0.5000 cm | TOPICAL_OINTMENT | Freq: Three times a day (TID) | CUTANEOUS | 1 refills | Status: AC

## 2020-05-03 MED ORDER — LEVONORGESTREL-ETHINYL ESTRAD 0.1-20 MG-MCG PO TABS: 1.0000 | ORAL_TABLET | Freq: Every day | ORAL | 11 refills | Status: AC

## 2020-05-03 NOTE — Progress Notes (Signed)
Spanish interpreter Eda.  Bryson Dames., RN

## 2020-05-03 NOTE — Progress Notes (Signed)
Alyssa Moran is an 34 y.o. female. [id number] who referred from health dept for genital warts. Pt has had HPV known for 1 year, varies in degree of severity, but has been more noticeable over past couple of months. Was given a cream, unsure of name, but has not helped.  Denies painful outbreaks, prodromal symptoms.   Pt also complains of RLQ pain for pat 3-4 days at onset of cycle. Pt endorses painful cycles for years, has not had OCP trial. Ibuprofen helps some.  This pain described as crampy, dull aching, radiating to back. Condoms for contraception.   Pertinent Gynecological History: Menses: flow is moderate Bleeding: intermenstrual bleeding Contraception: condoms DES exposure: unknown Blood transfusions: none Sexually transmitted diseases: no past history Previous GYN Procedures: none  Last mammogram: .Too early  Last pap: normal Date: 2019 OB History: G3, P3003   Menstrual History: Menarche age: 74 Patient's last menstrual period was 04/30/2020 (exact date).    Past Medical History:  Diagnosis Date  . Medical history non-contributory     Past Surgical History:  Procedure Laterality Date  . NO PAST SURGERIES      History reviewed. No pertinent family history.  Social History:  reports that she has never smoked. She has never used smokeless tobacco. She reports that she does not drink alcohol and does not use drugs.  Allergies: No Known Allergies  (Not in a hospital admission)   Review of Systems  Constitutional: Negative for activity change, appetite change, fatigue and unexpected weight change.  HENT: Negative.   Eyes: Negative.   Respiratory: Negative.  Negative for chest tightness, shortness of breath and wheezing.   Cardiovascular: Negative.  Negative for chest pain and leg swelling.  Gastrointestinal: Negative.  Negative for abdominal distention, abdominal pain, constipation, diarrhea, nausea and vomiting.  Endocrine: Negative.   Genitourinary:  Positive for menstrual problem and pelvic pain. Negative for dysuria and hematuria.  Neurological: Negative.  Negative for dizziness, weakness, light-headedness, numbness and headaches.  Hematological: Negative.   Psychiatric/Behavioral: Negative.  Negative for agitation, confusion and decreased concentration.    Blood pressure (!) 147/102, pulse 73, weight 179 lb 4.8 oz (81.3 kg), last menstrual period 04/30/2020, not currently breastfeeding. Physical Exam Vitals reviewed.  Constitutional:      Appearance: She is well-developed.  HENT:     Head: Normocephalic and atraumatic.  Eyes:     Pupils: Pupils are equal, round, and reactive to light.  Cardiovascular:     Rate and Rhythm: Normal rate.  Pulmonary:     Effort: Pulmonary effort is normal.  Abdominal:     General: There is no distension.     Palpations: Abdomen is soft. There is no mass.     Tenderness: There is abdominal tenderness. There is no guarding or rebound.     Comments: RLQ  Genitourinary:    Vagina: No vaginal discharge.     Comments: Condyloma noted on mons and right superior labia majora Musculoskeletal:     Cervical back: Normal range of motion.  Skin:    General: Skin is warm and dry.  Neurological:     Mental Status: She is alert and oriented to person, place, and time.  Psychiatric:        Behavior: Behavior normal.     No results found for this or any previous visit (from the past 24 hour(s)).  No results found.  Assessment/Plan: 34yo G3P3 w/ continued HPV and RLQ pain.   HPV: will start Veregen follow up for  improvement in 2-3 months Dysmenorrhea: TVUSG ordered for localized reproducible RLQ pain, given toradol for 5 days. Will start OCP trail, follow up in 3 months.   Malachy Chamber 05/03/2020, 9:22 AM

## 2020-05-12 ENCOUNTER — Telehealth: Payer: Self-pay | Admitting: *Deleted

## 2020-05-12 NOTE — Telephone Encounter (Addendum)
VM message left which is in [place].   9/9 1400  Pt's message was reviewed by interpreter Eda Royal. Pt stated that the cream she was prescribed is too expensive. She is requesting a different medication if possible.

## 2020-05-23 NOTE — Telephone Encounter (Addendum)
Called pt with [facility] interpreter Byrd Hesselbach ID [phone]. Pt reports she was told by pharmacy the cream recently prescribed will cost over 1000 dollars. 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 Confirmed on [facility] there are no options in this area at an affordable cost. Explained to pt I will follow up with provider regarding other treatment options.

## 2020-06-07 NOTE — Telephone Encounter (Signed)
Per Dr. Mayford Knife, pt may use Tea Tree oil, 3 times daily to affected area. I called pt w/interpreter Eda Royal. I informed her of Dr. Mayford Knife' instructions. She voiced understanding.

## 2020-08-07 ENCOUNTER — Ambulatory Visit: Payer: Self-pay | Admitting: Obstetrics and Gynecology

## 2021-08-03 ENCOUNTER — Encounter (HOSPITAL_BASED_OUTPATIENT_CLINIC_OR_DEPARTMENT_OTHER): Payer: Self-pay | Admitting: Emergency Medicine

## 2021-08-03 ENCOUNTER — Emergency Department (HOSPITAL_BASED_OUTPATIENT_CLINIC_OR_DEPARTMENT_OTHER)
Admission: EM | Admit: 2021-08-03 | Discharge: 2021-08-04 | Disposition: A | Discharge status: Home / Self Care | Payer: Self-pay | Attending: Emergency Medicine | Admitting: Emergency Medicine

## 2021-08-03 ENCOUNTER — Other Ambulatory Visit: Payer: Self-pay

## 2021-08-03 DIAGNOSIS — N3001 Acute cystitis with hematuria: Secondary | ICD-10-CM

## 2021-08-03 DIAGNOSIS — R309 Painful micturition, unspecified: Secondary | ICD-10-CM | POA: Insufficient documentation

## 2021-08-03 DIAGNOSIS — R1030 Lower abdominal pain, unspecified: Secondary | ICD-10-CM | POA: Insufficient documentation

## 2021-08-03 LAB — URINALYSIS, ROUTINE W REFLEX MICROSCOPIC
Bilirubin Urine: NEGATIVE
Glucose, UA: NEGATIVE mg/dL
Ketones, ur: NEGATIVE mg/dL
Nitrite: NEGATIVE
Protein, ur: 30 mg/dL — AB
Specific Gravity, Urine: 1.01 (ref 1.005–1.030)
pH: 6 (ref 5.0–8.0)

## 2021-08-03 LAB — URINALYSIS, MICROSCOPIC (REFLEX): WBC, UA: >50 WBC/hpf (ref 0–5)

## 2021-08-03 LAB — PREGNANCY, URINE: Preg Test, Ur: NEGATIVE

## 2021-08-03 MED ORDER — CEPHALEXIN 250 MG PO CAPS
500.0000 mg | ORAL_CAPSULE | Freq: Once | ORAL | Status: AC
  Administered 2021-08-04: 500 mg via ORAL
  Filled 2021-08-03: qty 2

## 2021-08-03 MED ORDER — IBUPROFEN 400 MG PO TABS
600.0000 mg | ORAL_TABLET | Freq: Once | ORAL | Status: AC
  Administered 2021-08-04: 600 mg via ORAL
  Filled 2021-08-03: qty 1

## 2021-08-03 MED ORDER — CEPHALEXIN 500 MG PO CAPS: 500.0000 mg | ORAL_CAPSULE | Freq: Three times a day (TID) | ORAL | 0 refills | Status: AC

## 2021-08-03 NOTE — Discharge Instructions (Signed)
Begin taking Keflex as prescribed.  Take ibuprofen 600 mg every 6 hours as needed for pain.  Drink plenty of fluids and get plenty of rest.  Return to the emergency department if you develop severe abdominal pain, high fevers, or other new and concerning symptoms.

## 2021-08-03 NOTE — ED Provider Notes (Signed)
[facility] EMERGENCY DEPARTMENT Provider Note   CSN: [id number] Arrival date & time: 08/03/21  2247     History Chief Complaint  Patient presents with   Abdominal Pain    Alyssa Moran is a 35 y.o. female.  Patient is a 35 year old female with no significant past medical history.  She presents today for evaluation of suprapubic pain and burning with urination.  This has been worsening over the past 2 days.  She denies any fevers or chills.  She has noticed blood-tinged urine on occasion.  She denies any bowel complaints.  5 months ago, she delivered a child but has been doing well since.  Patient speaks very little [place] and history taken with use of translator tablet at bedside.  The history is provided by the patient. The history is limited by a language barrier. A language interpreter was used.  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 No pertinent family history.  Social History   Tobacco Use   Smoking status: Never   Smokeless tobacco: Never  Vaping Use   Vaping Use: Never used  Substance Use Topics   Alcohol use: No   Drug use: No    Home Medications Prior  to Admission medications   Medication Sig Start Date End Date Taking? Authorizing Provider  ketorolac (TORADOL) 10 MG tablet Take 1 tablet (10 mg total) by mouth every 6 (six) hours as needed. 05/03/20   Malachy Chamber, MD  levonorgestrel-ethinyl estradiol Janann Colonel) 0.1-20 MG-MCG tablet Take 1 tablet by mouth daily. 05/03/20   Malachy Chamber, MD  Sinecatechins 15 % OINT Apply 0.5 cm topically in the morning, at noon, and at bedtime. 05/03/20   Malachy Chamber, MD    Allergies    Patient has no known allergies.  Review of Systems   Review of Systems  Constitutional:  Negative for fever.  Gastrointestinal:  Positive for abdominal pain.  Genitourinary:  Negative for vaginal bleeding.  All other systems reviewed and are negative.  Physical Exam Updated Vital Signs BP (!) 136/96   Pulse 92   Temp 98 F (36.7 C)   Resp 17   Ht 5\' 1"  (1.549 m)   Wt 81.6 kg   LMP  (Within Months)   SpO2 100%   BMI 34.01 kg/m   Physical Exam Vitals and nursing note reviewed.  Constitutional:      General: She  is not in acute distress.    Appearance: She is well-developed. She is not ill-appearing.  HENT:     Head: Normocephalic and atraumatic.  Cardiovascular:     Rate and Rhythm: Normal rate and regular rhythm.  Pulmonary:     Effort: Pulmonary effort is normal. No respiratory distress.     Breath sounds: Normal breath sounds. No stridor. No wheezing or rhonchi.  Abdominal:     Tenderness: There is abdominal tenderness in the suprapubic area. There is no right CVA tenderness, left CVA tenderness, guarding or rebound.  Skin:    General: Skin is warm and dry.  Neurological:     Mental Status: She is alert.    ED Results / Procedures / Treatments   Labs (all labs ordered are listed, but only abnormal results are displayed) Labs Reviewed  URINALYSIS, ROUTINE W REFLEX MICROSCOPIC  PREGNANCY, URINE    EKG None  Radiology No results found.  Procedures Procedures    Medications Ordered in ED Medications - No data to display  ED Course  I have reviewed the triage vital signs and the nursing notes.  Pertinent labs & imaging results that were available during my care of the patient were reviewed by me and considered in my medical decision making (see chart for details).    MDM Rules/Calculators/A&P  Patient presenting here with complaints of dysuria and suprapubic abdominal pain.  Urinalysis consistent with UTI.  Patient to be discharged with Keflex, ibuprofen, and follow-up as needed.  Final Clinical Impression(s) / ED Diagnoses Final diagnoses:  None    Rx / DC Orders ED Discharge Orders     None        Geoffery Lyons, MD 08/03/21 2356

## 2021-08-03 NOTE — ED Triage Notes (Signed)
Pt c/o lower abdominal pain x2 days and ongoing intermitted vaginal itch x5 months. States noticing blood spotting in urine, unknown if vaginal source. Per note vaginal child birth 5 months ago without complications. Denies NVD.   Pt is sp[place] speaking

## 2021-08-04 NOTE — ED Notes (Signed)
Patient discharged to home.  All discharge instructions reviewed.  Patient verbalized understanding via teachback method.  VS WDL.  Respirations even and unlabored.  Ambulatory out of ED.   °
